# Patient Record
Sex: Female | Born: 1970
Health system: Southern US, Community
[De-identification: ages and names within clinical notes are randomized; demographics above are authoritative.]

## PROBLEM LIST (undated history)

## (undated) DIAGNOSIS — I10 Essential (primary) hypertension: Secondary | ICD-10-CM

## (undated) DIAGNOSIS — E8881 Metabolic syndrome: Secondary | ICD-10-CM

## (undated) DIAGNOSIS — J309 Allergic rhinitis, unspecified: Secondary | ICD-10-CM

## (undated) DIAGNOSIS — M722 Plantar fascial fibromatosis: Secondary | ICD-10-CM

## (undated) DIAGNOSIS — F32A Depression, unspecified: Secondary | ICD-10-CM

## (undated) DIAGNOSIS — M47816 Spondylosis without myelopathy or radiculopathy, lumbar region: Secondary | ICD-10-CM

## (undated) DIAGNOSIS — H8109 Meniere's disease, unspecified ear: Secondary | ICD-10-CM

## (undated) DIAGNOSIS — H663X2 Other chronic suppurative otitis media, left ear: Secondary | ICD-10-CM

## (undated) DIAGNOSIS — R350 Frequency of micturition: Secondary | ICD-10-CM

## (undated) DIAGNOSIS — R112 Nausea with vomiting, unspecified: Secondary | ICD-10-CM

## (undated) DIAGNOSIS — R519 Headache, unspecified: Secondary | ICD-10-CM

## (undated) DIAGNOSIS — E65 Localized adiposity: Secondary | ICD-10-CM

## (undated) DIAGNOSIS — E119 Type 2 diabetes mellitus without complications: Secondary | ICD-10-CM

## (undated) DIAGNOSIS — F4310 Post-traumatic stress disorder, unspecified: Secondary | ICD-10-CM

## (undated) DIAGNOSIS — J45909 Unspecified asthma, uncomplicated: Secondary | ICD-10-CM

## (undated) DIAGNOSIS — E282 Polycystic ovarian syndrome: Secondary | ICD-10-CM

## (undated) DIAGNOSIS — Z9889 Other specified postprocedural states: Secondary | ICD-10-CM

## (undated) DIAGNOSIS — F329 Major depressive disorder, single episode, unspecified: Secondary | ICD-10-CM

## (undated) DIAGNOSIS — M545 Low back pain, unspecified: Secondary | ICD-10-CM

## (undated) DIAGNOSIS — M47812 Spondylosis without myelopathy or radiculopathy, cervical region: Secondary | ICD-10-CM

## (undated) DIAGNOSIS — E785 Hyperlipidemia, unspecified: Secondary | ICD-10-CM

## (undated) DIAGNOSIS — R7989 Other specified abnormal findings of blood chemistry: Secondary | ICD-10-CM

## (undated) DIAGNOSIS — R945 Abnormal results of liver function studies: Secondary | ICD-10-CM

## (undated) DIAGNOSIS — Z87448 Personal history of other diseases of urinary system: Secondary | ICD-10-CM

## (undated) DIAGNOSIS — F419 Anxiety disorder, unspecified: Secondary | ICD-10-CM

## (undated) DIAGNOSIS — M1990 Unspecified osteoarthritis, unspecified site: Secondary | ICD-10-CM

## (undated) DIAGNOSIS — E88819 Insulin resistance, unspecified: Secondary | ICD-10-CM

## (undated) DIAGNOSIS — M533 Sacrococcygeal disorders, not elsewhere classified: Secondary | ICD-10-CM

## (undated) DIAGNOSIS — M51369 Other intervertebral disc degeneration, lumbar region without mention of lumbar back pain or lower extremity pain: Secondary | ICD-10-CM

## (undated) DIAGNOSIS — L732 Hidradenitis suppurativa: Secondary | ICD-10-CM

## (undated) DIAGNOSIS — H02849 Edema of unspecified eye, unspecified eyelid: Secondary | ICD-10-CM

## (undated) DIAGNOSIS — R51 Headache: Secondary | ICD-10-CM

## (undated) DIAGNOSIS — Z87440 Personal history of urinary (tract) infections: Secondary | ICD-10-CM

## (undated) DIAGNOSIS — M5136 Other intervertebral disc degeneration, lumbar region: Secondary | ICD-10-CM

## (undated) DIAGNOSIS — M2550 Pain in unspecified joint: Secondary | ICD-10-CM

## (undated) DIAGNOSIS — N200 Calculus of kidney: Secondary | ICD-10-CM

## (undated) DIAGNOSIS — L680 Hirsutism: Secondary | ICD-10-CM

## (undated) DIAGNOSIS — G894 Chronic pain syndrome: Secondary | ICD-10-CM

## (undated) DIAGNOSIS — L409 Psoriasis, unspecified: Secondary | ICD-10-CM

## (undated) DIAGNOSIS — R0600 Dyspnea, unspecified: Secondary | ICD-10-CM

## (undated) DIAGNOSIS — M797 Fibromyalgia: Secondary | ICD-10-CM

## (undated) DIAGNOSIS — J329 Chronic sinusitis, unspecified: Secondary | ICD-10-CM

## (undated) HISTORY — PX: MYRINGOTOMY WITH TUBE PLACEMENT: SHX5663

## (undated) HISTORY — PX: KIDNEY STONE SURGERY: SHX686

## (undated) HISTORY — PX: SHOULDER SURGERY: SHX246

## (undated) HISTORY — PX: TONSILLECTOMY: SUR1361

## (undated) HISTORY — PX: DENTAL SURGERY: SHX609

## (undated) HISTORY — PX: FINGER SURGERY: SHX640

---
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
## 2012-10-23 ENCOUNTER — Ambulatory Visit: Payer: No Typology Code available for payment source

## 2012-10-29 ENCOUNTER — Ambulatory Visit: Payer: No Typology Code available for payment source | Attending: Sports Medicine

## 2012-10-29 DIAGNOSIS — IMO0001 Reserved for inherently not codable concepts without codable children: Secondary | ICD-10-CM | POA: Insufficient documentation

## 2012-10-29 DIAGNOSIS — M2560 Stiffness of unspecified joint, not elsewhere classified: Secondary | ICD-10-CM | POA: Insufficient documentation

## 2012-10-29 DIAGNOSIS — M545 Low back pain, unspecified: Secondary | ICD-10-CM | POA: Insufficient documentation

## 2012-10-29 DIAGNOSIS — R5381 Other malaise: Secondary | ICD-10-CM | POA: Insufficient documentation

## 2012-10-29 DIAGNOSIS — R293 Abnormal posture: Secondary | ICD-10-CM | POA: Insufficient documentation

## 2012-11-04 ENCOUNTER — Ambulatory Visit: Payer: No Typology Code available for payment source | Admitting: Physical Therapy

## 2012-11-06 ENCOUNTER — Ambulatory Visit: Payer: No Typology Code available for payment source

## 2012-11-10 ENCOUNTER — Ambulatory Visit: Payer: No Typology Code available for payment source

## 2012-11-13 ENCOUNTER — Ambulatory Visit: Payer: No Typology Code available for payment source

## 2013-03-25 DIAGNOSIS — J45909 Unspecified asthma, uncomplicated: Secondary | ICD-10-CM | POA: Insufficient documentation

## 2013-03-25 DIAGNOSIS — F419 Anxiety disorder, unspecified: Secondary | ICD-10-CM | POA: Insufficient documentation

## 2013-03-30 DIAGNOSIS — F1190 Opioid use, unspecified, uncomplicated: Secondary | ICD-10-CM | POA: Insufficient documentation

## 2013-03-30 HISTORY — PX: AXILLARY HIDRADENITIS EXCISION: SUR522

## 2013-09-07 DIAGNOSIS — L680 Hirsutism: Secondary | ICD-10-CM | POA: Insufficient documentation

## 2013-09-07 DIAGNOSIS — E785 Hyperlipidemia, unspecified: Secondary | ICD-10-CM | POA: Insufficient documentation

## 2013-09-07 DIAGNOSIS — E250 Congenital adrenogenital disorders associated with enzyme deficiency: Secondary | ICD-10-CM | POA: Insufficient documentation

## 2013-10-17 DIAGNOSIS — M47812 Spondylosis without myelopathy or radiculopathy, cervical region: Secondary | ICD-10-CM | POA: Insufficient documentation

## 2013-12-30 DIAGNOSIS — M533 Sacrococcygeal disorders, not elsewhere classified: Secondary | ICD-10-CM | POA: Insufficient documentation

## 2014-01-05 DIAGNOSIS — F329 Major depressive disorder, single episode, unspecified: Secondary | ICD-10-CM | POA: Insufficient documentation

## 2014-01-05 DIAGNOSIS — R0609 Other forms of dyspnea: Secondary | ICD-10-CM

## 2014-01-05 DIAGNOSIS — R350 Frequency of micturition: Secondary | ICD-10-CM | POA: Insufficient documentation

## 2014-01-05 DIAGNOSIS — M2550 Pain in unspecified joint: Secondary | ICD-10-CM | POA: Insufficient documentation

## 2014-01-05 DIAGNOSIS — M25511 Pain in right shoulder: Secondary | ICD-10-CM

## 2014-01-05 DIAGNOSIS — H8109 Meniere's disease, unspecified ear: Secondary | ICD-10-CM | POA: Insufficient documentation

## 2014-01-05 DIAGNOSIS — M7712 Lateral epicondylitis, left elbow: Secondary | ICD-10-CM | POA: Insufficient documentation

## 2014-01-05 DIAGNOSIS — G8929 Other chronic pain: Secondary | ICD-10-CM | POA: Insufficient documentation

## 2014-01-05 DIAGNOSIS — J309 Allergic rhinitis, unspecified: Secondary | ICD-10-CM | POA: Insufficient documentation

## 2014-01-05 DIAGNOSIS — N915 Oligomenorrhea, unspecified: Secondary | ICD-10-CM | POA: Insufficient documentation

## 2014-01-13 DIAGNOSIS — H9319 Tinnitus, unspecified ear: Secondary | ICD-10-CM | POA: Insufficient documentation

## 2015-06-26 ENCOUNTER — Encounter (HOSPITAL_COMMUNITY): Payer: Self-pay | Admitting: Nurse Practitioner

## 2015-06-26 ENCOUNTER — Emergency Department (HOSPITAL_COMMUNITY): Payer: BLUE CROSS/BLUE SHIELD

## 2015-06-26 ENCOUNTER — Observation Stay (HOSPITAL_COMMUNITY)
Admission: EM | Admit: 2015-06-26 | Discharge: 2015-06-27 | Disposition: A | Discharge status: Home / Self Care | Payer: BLUE CROSS/BLUE SHIELD | Attending: Internal Medicine | Admitting: Internal Medicine

## 2015-06-26 DIAGNOSIS — N179 Acute kidney failure, unspecified: Secondary | ICD-10-CM | POA: Insufficient documentation

## 2015-06-26 DIAGNOSIS — H6690 Otitis media, unspecified, unspecified ear: Secondary | ICD-10-CM | POA: Insufficient documentation

## 2015-06-26 DIAGNOSIS — E669 Obesity, unspecified: Secondary | ICD-10-CM | POA: Insufficient documentation

## 2015-06-26 DIAGNOSIS — Z6835 Body mass index (BMI) 35.0-35.9, adult: Secondary | ICD-10-CM | POA: Diagnosis not present

## 2015-06-26 DIAGNOSIS — N10 Acute tubulo-interstitial nephritis: Secondary | ICD-10-CM | POA: Diagnosis not present

## 2015-06-26 DIAGNOSIS — N390 Urinary tract infection, site not specified: Secondary | ICD-10-CM | POA: Diagnosis present

## 2015-06-26 DIAGNOSIS — I10 Essential (primary) hypertension: Secondary | ICD-10-CM | POA: Diagnosis not present

## 2015-06-26 DIAGNOSIS — R109 Unspecified abdominal pain: Secondary | ICD-10-CM

## 2015-06-26 DIAGNOSIS — M1990 Unspecified osteoarthritis, unspecified site: Secondary | ICD-10-CM | POA: Insufficient documentation

## 2015-06-26 DIAGNOSIS — A419 Sepsis, unspecified organism: Secondary | ICD-10-CM | POA: Diagnosis not present

## 2015-06-26 DIAGNOSIS — E282 Polycystic ovarian syndrome: Secondary | ICD-10-CM | POA: Diagnosis present

## 2015-06-26 DIAGNOSIS — Z87891 Personal history of nicotine dependence: Secondary | ICD-10-CM | POA: Diagnosis not present

## 2015-06-26 DIAGNOSIS — R740 Nonspecific elevation of levels of transaminase and lactic acid dehydrogenase [LDH]: Secondary | ICD-10-CM | POA: Diagnosis not present

## 2015-06-26 DIAGNOSIS — R000 Tachycardia, unspecified: Secondary | ICD-10-CM

## 2015-06-26 DIAGNOSIS — Z7984 Long term (current) use of oral hypoglycemic drugs: Secondary | ICD-10-CM | POA: Diagnosis not present

## 2015-06-26 DIAGNOSIS — N12 Tubulo-interstitial nephritis, not specified as acute or chronic: Secondary | ICD-10-CM | POA: Diagnosis present

## 2015-06-26 DIAGNOSIS — R509 Fever, unspecified: Secondary | ICD-10-CM | POA: Diagnosis present

## 2015-06-26 HISTORY — DX: Calculus of kidney: N20.0

## 2015-06-26 HISTORY — DX: Insulin resistance, unspecified: E88.819

## 2015-06-26 HISTORY — DX: Essential (primary) hypertension: I10

## 2015-06-26 HISTORY — DX: Metabolic syndrome: E88.81

## 2015-06-26 LAB — URINALYSIS, ROUTINE W REFLEX MICROSCOPIC
BILIRUBIN URINE: NEGATIVE
Glucose, UA: NEGATIVE mg/dL
KETONES UR: NEGATIVE mg/dL
NITRITE: POSITIVE — AB
PH: 6.5 (ref 5.0–8.0)
Protein, ur: 30 mg/dL — AB
SPECIFIC GRAVITY, URINE: 1.016 (ref 1.005–1.030)

## 2015-06-26 LAB — COMPREHENSIVE METABOLIC PANEL
ALK PHOS: 110 U/L (ref 38–126)
ALT: 170 U/L — AB (ref 14–54)
AST: 103 U/L — AB (ref 15–41)
Albumin: 3.4 g/dL — ABNORMAL LOW (ref 3.5–5.0)
Anion gap: 11 (ref 5–15)
BUN: 12 mg/dL (ref 6–20)
CALCIUM: 9.6 mg/dL (ref 8.9–10.3)
CHLORIDE: 105 mmol/L (ref 101–111)
CO2: 23 mmol/L (ref 22–32)
CREATININE: 1.18 mg/dL — AB (ref 0.44–1.00)
GFR, EST NON AFRICAN AMERICAN: 55 mL/min — AB (ref >60)
Glucose, Bld: 129 mg/dL — ABNORMAL HIGH (ref 65–99)
Potassium: 4.2 mmol/L (ref 3.5–5.1)
SODIUM: 139 mmol/L (ref 135–145)
Total Bilirubin: 1.5 mg/dL — ABNORMAL HIGH (ref 0.3–1.2)
Total Protein: 7 g/dL (ref 6.5–8.1)

## 2015-06-26 LAB — CBC WITH DIFFERENTIAL/PLATELET
BASOS PCT: 0 %
Basophils Absolute: 0 10*3/uL (ref 0.0–0.1)
EOS ABS: 0 10*3/uL (ref 0.0–0.7)
EOS PCT: 0 %
HCT: 37.8 % (ref 36.0–46.0)
Hemoglobin: 12.5 g/dL (ref 12.0–15.0)
LYMPHS ABS: 0.3 10*3/uL — AB (ref 0.7–4.0)
Lymphocytes Relative: 3 %
MCH: 29.2 pg (ref 26.0–34.0)
MCHC: 33.1 g/dL (ref 30.0–36.0)
MCV: 88.3 fL (ref 78.0–100.0)
MONO ABS: 0.7 10*3/uL (ref 0.1–1.0)
MONOS PCT: 7 %
NEUTROS PCT: 90 %
Neutro Abs: 8.4 10*3/uL — ABNORMAL HIGH (ref 1.7–7.7)
PLATELETS: 163 10*3/uL (ref 150–400)
RBC: 4.28 MIL/uL (ref 3.87–5.11)
RDW: 13.4 % (ref 11.5–15.5)
WBC: 9.4 10*3/uL (ref 4.0–10.5)

## 2015-06-26 LAB — URINE MICROSCOPIC-ADD ON

## 2015-06-26 LAB — I-STAT CG4 LACTIC ACID, ED
LACTIC ACID, VENOUS: 0.98 mmol/L (ref 0.5–2.0)
Lactic Acid, Venous: 1.15 mmol/L (ref 0.5–2.0)

## 2015-06-26 MED ORDER — SODIUM CHLORIDE 0.9 % IV SOLN: INTRAVENOUS | Status: DC

## 2015-06-26 MED ORDER — ONDANSETRON HCL 4 MG/2ML IJ SOLN
4.0000 mg | Freq: Once | INTRAMUSCULAR | Status: AC
  Administered 2015-06-26: 4 mg via INTRAVENOUS
  Filled 2015-06-26: qty 2

## 2015-06-26 MED ORDER — SODIUM CHLORIDE 0.9 % IV SOLN: Freq: Once | INTRAVENOUS | Status: DC

## 2015-06-26 MED ORDER — LORAZEPAM 2 MG/ML IJ SOLN
1.0000 mg | Freq: Once | INTRAMUSCULAR | Status: AC
  Administered 2015-06-26: 1 mg via INTRAVENOUS
  Filled 2015-06-26: qty 1

## 2015-06-26 MED ORDER — SODIUM CHLORIDE 0.9 % IV BOLUS (SEPSIS)
2000.0000 mL | Freq: Once | INTRAVENOUS | Status: AC
  Administered 2015-06-26: 2000 mL via INTRAVENOUS

## 2015-06-26 MED ORDER — SODIUM CHLORIDE 0.9 % IV SOLN
INTRAVENOUS | Status: DC
  Administered 2015-06-26: 20:00:00 via INTRAVENOUS

## 2015-06-26 MED ORDER — MORPHINE SULFATE (PF) 4 MG/ML IV SOLN
4.0000 mg | Freq: Once | INTRAVENOUS | Status: AC
  Administered 2015-06-26: 4 mg via INTRAVENOUS
  Filled 2015-06-26: qty 1

## 2015-06-26 MED ORDER — ENOXAPARIN SODIUM 40 MG/0.4ML ~~LOC~~ SOLN: 40.0000 mg | SUBCUTANEOUS | Status: DC

## 2015-06-26 MED ORDER — ACETAMINOPHEN 500 MG PO TABS: 1000.0000 mg | ORAL_TABLET | Freq: Once | ORAL | Status: DC

## 2015-06-26 MED ORDER — HYDROCODONE-ACETAMINOPHEN 10-325 MG PO TABS
1.0000 | ORAL_TABLET | Freq: Three times a day (TID) | ORAL | Status: DC | PRN
  Administered 2015-06-27 (×2): 1 via ORAL
  Filled 2015-06-26 (×2): qty 1

## 2015-06-26 MED ORDER — LORAZEPAM 2 MG/ML IJ SOLN: 1.0000 mg | Freq: Once | INTRAMUSCULAR | Status: DC

## 2015-06-26 MED ORDER — SODIUM CHLORIDE 0.9 % IV BOLUS (SEPSIS)
1000.0000 mL | Freq: Once | INTRAVENOUS | Status: AC
  Administered 2015-06-26: 1000 mL via INTRAVENOUS

## 2015-06-26 MED ORDER — DEXTROSE 5 % IV SOLN
1.0000 g | INTRAVENOUS | Status: DC
  Administered 2015-06-26: 1 g via INTRAVENOUS
  Filled 2015-06-26 (×2): qty 10

## 2015-06-26 MED ORDER — ACETAMINOPHEN 500 MG PO TABS
1000.0000 mg | ORAL_TABLET | Freq: Once | ORAL | Status: AC
  Administered 2015-06-26: 1000 mg via ORAL
  Filled 2015-06-26: qty 2

## 2015-06-26 MED ORDER — SODIUM CHLORIDE 0.9 % IV SOLN
INTRAVENOUS | Status: AC
  Administered 2015-06-27 (×2): via INTRAVENOUS

## 2015-06-26 NOTE — ED Notes (Signed)
She c/o 2 day history of chills and sweats, back pain, n/v, foul smelling urine. She has been taking tylenol with fever reduction but fever returns. She denies cough, congestion. She is A&Ox4, resp e/u

## 2015-06-26 NOTE — ED Notes (Signed)
Pt in xray, xray to bring over when completed.

## 2015-06-26 NOTE — ED Provider Notes (Signed)
CSN: [id number]     Arrival date & time 06/26/15  1808 History   First MD Initiated Contact with Patient 06/26/15 1840     Chief Complaint  Patient presents with  . Fever     (Consider location/radiation/quality/duration/timing/severity/associated sxs/prior Treatment) HPI Comments: Patient here complaining of two-day history of chills and myalgias foul-smelling urine slight nonreactive cough. Has had positive sick exposures. Denies any neck pain or headache or photophobia. Has had some lower back pain without hematuria or dysuria. Symptoms have been persistent. Denies any trouble swallowing. Has used over-the-counter medications without relief  Patient is a 45 y.o. female presenting with fever. The history is provided by the patient and the spouse.  Fever   Past Medical History  Diagnosis Date  . Arthritis   . Kidney stones   . Insulin resistance   . Hypertension    Past Surgical History  Procedure Laterality Date  . Cesarean section    . Tonsillectomy     History reviewed. No pertinent family history. Social History  Substance Use Topics  . Smoking status: Never Smoker   . Smokeless tobacco: None  . Alcohol Use: No   OB History    No data available     Review of Systems  Constitutional: Positive for fever.  All other systems reviewed and are negative.     Allergies  Darvon and Lyrica  Home Medications   Prior to Admission medications   Not on File   BP 111/76 mmHg  Pulse 129  Temp(Src) 98.8 F (37.1 C) (Oral)  Resp 16  Ht  (1.676 m)  Wt 93.441 kg  BMI 33.27 kg/m2  SpO2 98% Physical Exam  Constitutional: She is oriented to person, place, and time. She appears well-developed and well-nourished.  Non-toxic appearance. No distress.  HENT:  Head: Normocephalic and atraumatic.  Eyes: Conjunctivae, EOM and lids are normal. Pupils are equal, round, and reactive to light.  Neck: Normal range of motion. Neck supple. No tracheal deviation present. No  thyroid mass present.  Cardiovascular: Regular rhythm and normal heart sounds.  Tachycardia present.  Exam reveals no gallop.   No murmur heard. Pulmonary/Chest: Effort normal and breath sounds normal. No stridor. No respiratory distress. She has no decreased breath sounds. She has no wheezes. She has no rhonchi. She has no rales.  Abdominal: Soft. Normal appearance and bowel sounds are normal. She exhibits no distension. There is no tenderness. There is no rebound and no CVA tenderness.  Musculoskeletal: Normal range of motion. She exhibits no edema or tenderness.  Neurological: She is alert and oriented to person, place, and time. She has normal strength. No cranial nerve deficit or sensory deficit. GCS eye subscore is 4. GCS verbal subscore is 5. GCS motor subscore is 6.  Skin: Skin is warm and dry. No abrasion and no rash noted.  Psychiatric: She has a normal mood and affect. Her speech is normal and behavior is normal.  Nursing note and vitals reviewed.   ED Course  Procedures (including critical care time) Labs Review Labs Reviewed  COMPREHENSIVE METABOLIC PANEL - Abnormal; Notable for the following:    Glucose, Bld 129 (*)    Creatinine, Ser 1.18 (*)    Albumin 3.4 (*)    AST 103 (*)    ALT 170 (*)    Total Bilirubin 1.5 (*)    GFR calc non Af Amer 55 (*)    All other components within normal limits  CBC WITH DIFFERENTIAL/PLATELET - Abnormal; Notable  for the following:    Neutro Abs 8.4 (*)    Lymphs Abs 0.3 (*)    All other components within normal limits  URINE CULTURE  URINALYSIS, ROUTINE W REFLEX MICROSCOPIC (NOT AT [facility])  I-STAT CG4 LACTIC ACID, ED    Imaging Review No results found. I have personally reviewed and evaluated these images and lab results as part of my medical decision-making.   EKG Interpretation   Date/Time:  Sunday June 26 2015 22:15:44 EST Ventricular Rate:  153 PR Interval:  137 QRS Duration: 73 QT Interval:  311 QTC Calculation: 496 R  Axis:   83 Text Interpretation:  Sinus tachycardia Borderline repolarization  abnormality Borderline prolonged QT interval Confirmed by Freida Busman  MD,  Shaneal Barasch ([id number]) on 06/26/2015 10:46:17 PM      MDM   Final diagnoses:  None    Patient given IV fluids here for possible influenza versus UTI. During her stay in the ED she became tachycardic and sent to having [facility]. Her monitor showed a sinus tachycardia to 160. She was treated with Tylenol, IV fluids. She says she feels very anxious and does have a history of PTSD. She was given Ativan and morphine. Heart rate decreased down into the 140s. Repeat lactate is been ordered. She was started on Rocephin for suspected UTI. Will be admitted to the medicine service  CRITICAL CARE Performed by: Toy Baker Total critical care time: 50 minutes Critical care time was exclusive of separately billable procedures and treating other patients. Critical care was necessary to treat or prevent imminent or life-threatening deterioration. Critical care was time spent personally by me on the following activities: development of treatment plan with patient and/or surrogate as well as nursing, discussions with consultants, evaluation of patient's response to treatment, examination of patient, obtaining history from patient or surrogate, ordering and performing treatments and interventions, ordering and review of laboratory studies, ordering and review of radiographic studies, pulse oximetry and re-evaluation of patient's condition.     Lorre Nick, MD 06/26/15 [phone]

## 2015-06-26 NOTE — ED Notes (Signed)
Ambulating to restroom.

## 2015-06-26 NOTE — ED Notes (Signed)
Admitting at bedside 

## 2015-06-27 ENCOUNTER — Encounter (HOSPITAL_COMMUNITY): Payer: Self-pay | Admitting: Internal Medicine

## 2015-06-27 ENCOUNTER — Inpatient Hospital Stay (HOSPITAL_COMMUNITY): Payer: BLUE CROSS/BLUE SHIELD

## 2015-06-27 DIAGNOSIS — B9689 Other specified bacterial agents as the cause of diseases classified elsewhere: Secondary | ICD-10-CM

## 2015-06-27 DIAGNOSIS — N10 Acute tubulo-interstitial nephritis: Secondary | ICD-10-CM | POA: Diagnosis not present

## 2015-06-27 DIAGNOSIS — R740 Nonspecific elevation of levels of transaminase and lactic acid dehydrogenase [LDH]: Secondary | ICD-10-CM

## 2015-06-27 DIAGNOSIS — E282 Polycystic ovarian syndrome: Secondary | ICD-10-CM | POA: Diagnosis present

## 2015-06-27 DIAGNOSIS — H6693 Otitis media, unspecified, bilateral: Secondary | ICD-10-CM

## 2015-06-27 DIAGNOSIS — H6690 Otitis media, unspecified, unspecified ear: Secondary | ICD-10-CM | POA: Diagnosis present

## 2015-06-27 DIAGNOSIS — I10 Essential (primary) hypertension: Secondary | ICD-10-CM | POA: Diagnosis present

## 2015-06-27 DIAGNOSIS — N12 Tubulo-interstitial nephritis, not specified as acute or chronic: Secondary | ICD-10-CM | POA: Diagnosis present

## 2015-06-27 DIAGNOSIS — N179 Acute kidney failure, unspecified: Secondary | ICD-10-CM

## 2015-06-27 DIAGNOSIS — E279 Disorder of adrenal gland, unspecified: Secondary | ICD-10-CM | POA: Diagnosis not present

## 2015-06-27 DIAGNOSIS — Z87891 Personal history of nicotine dependence: Secondary | ICD-10-CM

## 2015-06-27 DIAGNOSIS — N390 Urinary tract infection, site not specified: Secondary | ICD-10-CM | POA: Diagnosis present

## 2015-06-27 LAB — BASIC METABOLIC PANEL
ANION GAP: 10 (ref 5–15)
BUN: 9 mg/dL (ref 6–20)
CHLORIDE: 112 mmol/L — AB (ref 101–111)
CO2: 19 mmol/L — ABNORMAL LOW (ref 22–32)
Calcium: 8.2 mg/dL — ABNORMAL LOW (ref 8.9–10.3)
Creatinine, Ser: 0.98 mg/dL (ref 0.44–1.00)
GFR calc Af Amer: >60 mL/min (ref >60)
GLUCOSE: 95 mg/dL (ref 65–99)
POTASSIUM: 3.9 mmol/L (ref 3.5–5.1)
Sodium: 141 mmol/L (ref 135–145)

## 2015-06-27 LAB — GLUCOSE, CAPILLARY
GLUCOSE-CAPILLARY: 107 mg/dL — AB (ref 65–99)
GLUCOSE-CAPILLARY: 96 mg/dL (ref 65–99)
GLUCOSE-CAPILLARY: 118 mg/dL — AB (ref 65–99)

## 2015-06-27 LAB — PREGNANCY, URINE: Preg Test, Ur: NEGATIVE

## 2015-06-27 LAB — LACTIC ACID, PLASMA
LACTIC ACID, VENOUS: 1.4 mmol/L (ref 0.5–2.0)
Lactic Acid, Venous: 1.3 mmol/L (ref 0.5–2.0)

## 2015-06-27 LAB — INFLUENZA PANEL BY PCR (TYPE A & B)
H1N1 flu by pcr: NOT DETECTED
INFLAPCR: NEGATIVE
Influenza B By PCR: NEGATIVE

## 2015-06-27 LAB — MRSA PCR SCREENING: MRSA BY PCR: NEGATIVE

## 2015-06-27 MED ORDER — CIPROFLOXACIN HCL 500 MG PO TABS
500.0000 mg | ORAL_TABLET | Freq: Two times a day (BID) | ORAL | Status: DC
  Administered 2015-06-27: 500 mg via ORAL
  Filled 2015-06-27: qty 1

## 2015-06-27 MED ORDER — PROMETHAZINE HCL 25 MG/ML IJ SOLN: 12.5000 mg | Freq: Four times a day (QID) | INTRAMUSCULAR | Status: DC | PRN

## 2015-06-27 MED ORDER — CIPROFLOXACIN HCL 500 MG PO TABS: 500.0000 mg | ORAL_TABLET | Freq: Two times a day (BID) | ORAL | Status: AC

## 2015-06-27 MED ORDER — ENOXAPARIN SODIUM 40 MG/0.4ML ~~LOC~~ SOLN: 40.0000 mg | SUBCUTANEOUS | Status: DC

## 2015-06-27 NOTE — Progress Notes (Signed)
   Subjective: Patient feeling much better this morning, feels comfortable switching to oral antibiotics and going home today. Has no complaints. Objective: Vital signs in last 24 hours: Filed Vitals:   06/27/15 0600 06/27/15 0630 06/27/15 0700 06/27/15 0800  BP: 102/72 95/60 96/64  94/68  Pulse: 99 105 96 95  Temp:    98.3 F (36.8 C)  TempSrc:    Oral  Resp: Height:      Weight:      SpO2: 98% 98% 98% 98%   Weight change:   Intake/Output Summary (Last 24 hours) at 06/27/15 1107 Last data filed at 06/27/15 0800  Gross per 24 hour  Intake 1092.5 ml  Output      0 ml  Net 1092.5 ml   General: resting in bed, no acute distress Cardiac: mild tachycardia, no rubs, murmurs or gallops Pulm: clear to auscultation bilaterally, moving normal volumes of air Abd: soft, nontender, nondistended, BS present Back: Slight left CVA tenderness, no tenderness on right Ext: warm and well perfused, no pedal edema Neuro: alert and oriented X3   Assessment/Plan: Active Problems:   Sepsis (HCC)   PCOS (polycystic ovarian syndrome)   HTN (hypertension)   UTI (urinary tract infection)   Pyelonephritis   Chronic otitis media  Acute complicated pyelonephritis: Patient with history recurrent nephrolithiasis and kidney infections. UA cloudy in appearance and showing many bacteria, large Hgb, moderate leukocytes, nitrite+, and pyuria (6-30 WBCs). CT with bilateral non-obstructive nephrolithiasis with largest up to 5 mm on the left and left sided pyelonephritis. -Discontinue IV Ceftriaxone (2/26-2/26) -Start oral Ciprofloxacin 500 mg q12h for 10 total days (2/27 -> 3/8) -F/u with PCP  Chronic otitis media Patient reports having a history of ear infections and tympanostomy tubes placed in her ears in the past. She has purulent drainage from her ears for the past 2-3 weeks. -Ciprofloxacin as above -Will need outpatient ENT appointment  Transaminitis Elevated AST 103, ALT 170, and T  bili 1.5. ALP normal (110). No prior records of LFTs. She denies any abdominal pain, no tenderness on exam. She takes several OTC/ herbal supplements which might play a role. May also be due to fatty liver, occasional alcohol use, or viral. -f/u hepatitis panel  AKI Likely due to dehydration from decreased PO intake. SCr 1.18 on admission, no baseline on file to compare. Creatinine trended down to 0.98 after fluid resuscitation.   Dispo: Discharge today to home with oral antibiotics.    LOS: 1 day   Darreld Mclean, MD 06/27/2015, 11:07 AM

## 2015-06-27 NOTE — ED Notes (Signed)
Re attempting to page admitting provider

## 2015-06-27 NOTE — H&P (Signed)
Date: 06/27/2015               Patient Name:  Misty [name] MRN: [id number]  DOB: Jul 01, 1970 Age / Sex: 45 y.o., female   PCP: Ellin Saba         Medical Service: Internal Medicine Teaching Service         Attending Physician: Dr. Doneen Poisson, MD    First Contact: Dr. Allena Katz  Pager: [phone]   Second Contact: Dr. Senaida Ores  Pager: [phone]        After Hours (After 5p/  First Contact Pager: [phone]  weekends / holidays): Second Contact Pager: [phone]   Chief Complaint: Fevers, chills, nausea, vomiting, cough, chest pain with coughing, flank pain   History of Present Illness: Patient is a 45 year old female with a past medical history of hypertension, nephrolithiasis, arthritis, PCOS presenting to the hospital with a 2 day history of fevers, chills, nausea, vomiting, shortness of breath, nonproductive-cough, pleuritic chest pain, and flank pain. Patient states she's been having fevers with temperatures up to 103F, headaches (frontal), and has been vomiting up to 5-6 times a day. Vomit is clear, mucousy and contains chunks of food. Denies having any abdominal pain or hemoptysis. She has not been able to keep any food down. Also reports noticing pus coming out of her ears for the past 2-3 weeks and states she self medicated at home with rubbing alcohol and eardrops/eyedrops as she has had several "ear infections" and tympanostomy tubes placed in her ears in the past. Reports having shortness of breath, nonproductive cough, and pleuritic chest pain for the past 2 days. Denies having any wheezing. States her throat has been feeling sore from coughing but does not have any rhinorrhea. Patient denies any history of recent travel or history of blood clots. States she is physically active and does not use any birth control pills. She is a former smoker-2-3 cigarettes per day 3 years; quit 2 years ago. She is complaining of having left-sided flank pain for the past 2 days but no dysuria, urinary  frequency or urgency. Reports losing 85 pounds in the past 2 months from eating healthy. Denies having any low-grade fevers, malaise/fatigue (prior to 2 days ago), or any melena/hematochezia. She does report having small nodule on her neck for the past 20+ years and states she was told by her doctor it was a carbuncle.  In the ED, patient was found to be hypotensive (BP 90/53) and tachycardic (HR 150s). She was given 4 L of IV fluid boluses.  Meds: Current Facility-Administered Medications  Medication Dose Route Frequency Provider Last Rate Last Dose  . 0.9 %  sodium chloride infusion   Intravenous Continuous Onnie Boer, MD 150 mL/hr at 06/27/15 0043    . cefTRIAXone (ROCEPHIN) 1 g in dextrose 5 % 50 mL IVPB  1 g Intravenous Q24H Onnie Boer, MD   Stopped at 06/26/15 2302  . enoxaparin (LOVENOX) injection 40 mg  40 mg Subcutaneous Q24H Ejiroghene E Emokpae, MD      . HYDROcodone-acetaminophen (NORCO) 10-325 MG per tablet 1 tablet  1 tablet Oral TID PRN Ejiroghene Wendall Stade, MD       Current Outpatient Prescriptions  Medication Sig Dispense Refill  . ARGININE PO Take 1 tablet by mouth at bedtime.    Marland Kitchen b complex vitamins tablet Take 1 tablet by mouth daily.    . Ginkgo Biloba (GNP GINGKO BILOBA EXTRACT PO) Take 1 tablet by mouth daily.    Marland Kitchen  HYDROcodone-acetaminophen (NORCO) 10-325 MG tablet Take 1 tablet by mouth 3 (three) times daily. scheduled  0  . IRON PO Take 1 tablet by mouth at bedtime.    Marland Kitchen LYSINE PO Take 1 tablet by mouth daily.    Marland Kitchen MAGNESIUM PO Take 2 tablets by mouth at bedtime.    . metFORMIN (GLUCOPHAGE) 850 MG tablet Take 850 mg by mouth 2 (two) times daily with a meal.   4  . Misc Natural Products (GLUCOSAMINE CHONDROITIN MSM) TABS Take 2 tablets by mouth at bedtime.    . Multiple Vitamins-Minerals (HAIR/SKIN/NAILS/BIOTIN) TABS Take 3 tablets by mouth daily.    . Multiple Vitamins-Minerals (ZINC PO) Take 1 tablet by mouth at bedtime.    . naproxen sodium  (ALEVE) 220 MG tablet Take 440 mg by mouth 2 (two) times daily as needed (fever).    . nystatin (MYCOSTATIN/NYSTOP) 100000 UNIT/GM POWD Apply 1 application topically 2 (two) times daily as needed (rash under breasts).   11  . ofloxacin (FLOXIN) 0.3 % otic solution Place 2 drops into the left ear daily. Started approx 1st week of February    . Omega-3 Fatty Acids (FISH OIL) 1200 MG CAPS Take 1,200 mg by mouth at bedtime.    . Probiotic Product (PROBIOTIC PO) Take 1 tablet by mouth 3 (three) times daily.    Marland Kitchen PROLINE PO Take 1 tablet by mouth daily.    . traMADol (ULTRAM) 50 MG tablet Take 100 mg by mouth every 8 (eight) hours as needed (pain).   1    Allergies: Allergies as of 06/26/2015 - Review Complete 06/26/2015  Allergen Reaction Noted  . Darvon [propoxyphene] Other (See Comments) 06/26/2015  . Lyrica [pregabalin] Itching and Swelling 06/26/2015  . Metronidazole Nausea And Vomiting 06/26/2015  . Adhesive [tape] Itching and Rash 06/26/2015   Past Medical History  Diagnosis Date  . Arthritis   . Kidney stones   . Insulin resistance   . Hypertension    Past Surgical History  Procedure Laterality Date  . Cesarean section    . Tonsillectomy     Family History  Problem Relation Age of Onset  . COPD Mother   . Hypertension Mother    Social History   Social History  . Marital Status: Married    Spouse Name: N/A  . Number of Children: N/A  . Years of Education: N/A   Occupational History  . Not on file.   Social History Main Topics  . Smoking status: Former Games developer  . Smokeless tobacco: Not on file     Comment: 2-3 cigarettes per day 3 years; quit 2 years ago  . Alcohol Use: 0.0 oz/week    0 Standard drinks or equivalent per week     Comment: occasional   . Drug Use: No  . Sexual Activity: Not on file   Other Topics Concern  . Not on file   Social History Narrative    Review of Systems: Review of Systems  Constitutional: Positive for fever, chills and  malaise/fatigue.  HENT: Positive for ear discharge. Negative for congestion.   Eyes: Negative for blurred vision and pain.  Respiratory: Positive for cough and shortness of breath. Negative for sputum production and wheezing.   Cardiovascular: Negative for palpitations and leg swelling.       Pleuritic chest pain  Gastrointestinal: Positive for nausea and vomiting. Negative for abdominal pain, diarrhea, constipation, blood in stool and melena.  Genitourinary: Positive for flank pain. Negative for dysuria, urgency and frequency.  Hematuria (chronic, intermittent)   Musculoskeletal: Negative for back pain and joint pain.  Skin: Negative for itching and rash.  Neurological: Positive for headaches. Negative for sensory change and focal weakness.    Physical Exam: Blood pressure 87/64, pulse 115, temperature 98.8 F (37.1 C), temperature source Rectal, resp. rate 19, height  (1.676 m), weight 93.441 kg (206 lb), SpO2 100 %. Physical Exam  Constitutional: She is oriented to person, place, and time. She appears well-developed and well-nourished. No distress.  Face appears flushed   HENT:  Head: Normocephalic and atraumatic.  Tongue appears dry Oropharynx slightly erythematous Left ear: Tympanic membrane is pearly white Right ear: Slight purulent effusion noted behind the tympanic membrane  Eyes: EOM are normal. Pupils are equal, round, and reactive to light.  Neck: Neck supple. No tracheal deviation present.  Cardiovascular: Intact distal pulses.  Exam reveals no gallop and no friction rub.   No murmur heard. Tachycardic (heart rate in 150s)  Pulmonary/Chest: Effort normal and breath sounds normal. No respiratory distress. She has no wheezes. She has no rales.  Abdominal: Soft. Bowel sounds are normal. She exhibits no distension. There is no rebound and no guarding.  Abdominal striae noted Mild RUQ tenderness No hepatomegaly appreciated   Musculoskeletal: She exhibits no edema.   + Left-sided CVA tenderness Calves are non-swollen, non-erythematous, symmetrical in size, and NTTP.  Neurological: She is alert and oriented to person, place, and time.  Skin: Skin is warm and dry. No rash noted.  2 x 2 centimeter nodule noted on the posterior aspect of the neck (R side) - chronic as per patient. NTTP.   Psychiatric: She has a normal mood and affect. Her behavior is normal.    Lab results: Basic Metabolic Panel:  Recent Labs  09/81/19 1842 06/27/15 0027  NA 139 141  K 4.2 3.9  CL 105 112*  CO2 23 19*  GLUCOSE 129* 95  BUN 12 9  CREATININE 1.18* 0.98  CALCIUM 9.6 8.2*   Liver Function Tests:  Recent Labs  06/26/15 1842  AST 103*  ALT 170*  ALKPHOS 110  BILITOT 1.5*  PROT 7.0  ALBUMIN 3.4*   CBC:  Recent Labs  06/26/15 1842  WBC 9.4  NEUTROABS 8.4*  HGB 12.5  HCT 37.8  MCV 88.3  PLT 163   Urinalysis:  Recent Labs  06/26/15 2133  COLORURINE YELLOW  LABSPEC 1.016  PHURINE 6.5  GLUCOSEU NEGATIVE  HGBUR LARGE*  BILIRUBINUR NEGATIVE  KETONESUR NEGATIVE  PROTEINUR 30*  NITRITE POSITIVE*  LEUKOCYTESUR MODERATE*   Imaging results:  Dg Chest 2 View  06/26/2015  CLINICAL DATA:  Fever, shortness of breath, and nausea and vomiting 3 days. Former smoker. Hypertension. EXAM: CHEST  2 VIEW COMPARISON:  None. FINDINGS: The heart size and mediastinal contours are within normal limits. Both lungs are clear. The visualized skeletal structures are unremarkable. IMPRESSION: No active cardiopulmonary disease. Electronically Signed   By: Myles Rosenthal M.D.   On: 06/26/2015 19:42    Other results: EKG: Sinus tachycardia (HR 153). PR interval normal. QRS duration normal. QTc prolongation (496).   Assessment & Plan by Problem: Active Problems:   Sepsis (HCC)   PCOS (polycystic ovarian syndrome)   HTN (hypertension)   UTI (urinary tract infection)   Pyelonephritis   Chronic otitis media  Sepsis likely 2/2 UTI and acute complicated pyelonephritis    Patient presented hypotensive (BP 90/53) and tachycardic (HR 150s). Reports having subjective fevers, chills, headaches, nausea, and vomiting. Lactic acid  normal. Influenza PCR negative. Physical exam remarkable for left-sided CVA tenderness. Pt does report having a history of recurrent nephrolithiasis and kidney infections. UA cloudy in appearance and showing many bacteria, large Hgb, moderate leukocytes, nitrite+, and pyuria (6-30 WBCs). Patient does report having SOB, non-productive cough, and pleuritic chest pain but CXR is normal making pneumonia less likely. Symptoms could likely be due to a viral URI. She was given 4L of IVF boluses in the ED but continued to be hypotensive and tachycardic. Patient was seen by PCCM and they suggested monitor her in the stepdown unit because her MAP is >65 at this time and mental status good indicating patient is perfusing well.   -Admit to stepdown  -Normal saline at 150 mL per hour -IV ceftriaxone 1 g daily for acute complicated pyelonephritis -Phenergan 12.5 mg q6 prn N/V -Tylenol 1000 mg given once in the ED for headache. Avoid giving any more acetaminophen in the setting transaminits.  -Norco 10-325 TID prn flank pain -Checking lactic acid every 3 hours -Follow-up a.m. EKG -CT abd/pelvis w/o contrast  -Blood cultures pending -Urine culture pending   Transaminitis Elevated AST 103, ALT 170, and T bili 1.5. ALP normal (110). We do not have any prior records of LFTs done on this patient. She denies any history of liver disease but did complain of mild right upper quadrant tenderness upon palpation. Drinks alcohol only on occasion. Patient is obese (BMI 33.3), as such, fatty liver could possibly account for her transaminitis. In addition, she reports taking several OTC/ herbal supplements which could also play a role. Regardless, there remains a concern for viral hepatitis. -Checking Hepatitis panel; pending  Chronic otitis media Patient reports having a  history of ear infections and tympanostomy tubes placed in her ears in the past. She had been noticing purulent drainage from her ears for the past 2-3 weeks. Physical exam remarkable for purulent effusion behind her right tympanic membrane. Home medication list contains Ofloxacin ear drops, which could be the drops the patient has been using with failure of resolution of symptoms.  -IV ceftriaxone 1 g daily -Since these episodes are recurrent for this patient, she will need outpatient ENT follow-up.   AKI Likely due to dehydration from decreased PO intake. SCr 1.18 on admission, no baseline on file to compare. Creatinine trended down to 0.98 after fluid resuscitation.  -Continue to monitor BMP   DVT/PE prophylaxis: Lovenox  Diet: Carb modified  Code: Full  Dispo: Disposition is deferred at this time, awaiting improvement of current medical problems. Anticipated discharge in approximately 2-3 day(s).   The patient does have a current PCP (Sheryl Nicola Girt) and does need an [facility] hospital follow-up appointment after discharge.  The patient does not have transportation limitations that hinder transportation to clinic appointments.  Signed: John Giovanni, MD 06/27/2015, 3:05 AM

## 2015-06-27 NOTE — Discharge Summary (Signed)
Name: Misty [name] MRN: [id number] DOB: 1971-02-03 45 y.o. PCP: Ellin Saba  Date of Admission: 06/26/2015  6:32 PM Date of Discharge: 06/27/2015 Attending Physician: Doneen Poisson, MD  Discharge Diagnosis: 1. Left sided Pyleonephritis Active Problems:   Sepsis (HCC)   PCOS (polycystic ovarian syndrome)   HTN (hypertension)   UTI (urinary tract infection)   Pyelonephritis   Chronic otitis media  Discharge Medications:   Medication List    STOP taking these medications        ARGININE PO     b complex vitamins tablet     Glucosamine Chondroitin MSM Tabs     GNP GINGKO BILOBA EXTRACT PO     HAIR/SKIN/NAILS/BIOTIN Tabs     IRON PO     LYSINE PO     MAGNESIUM PO     PROLINE PO     ZINC PO      TAKE these medications        ALEVE 220 MG tablet  Generic drug:  naproxen sodium  Take 440 mg by mouth 2 (two) times daily as needed (fever).     ciprofloxacin 500 MG tablet  Commonly known as:  CIPRO  Take 1 tablet (500 mg total) by mouth 2 (two) times daily.     Fish Oil 1200 MG Caps  Take 1,200 mg by mouth at bedtime.     HYDROcodone-acetaminophen 10-325 MG tablet  Commonly known as:  NORCO  Take 1 tablet by mouth 3 (three) times daily. scheduled     metFORMIN 850 MG tablet  Commonly known as:  GLUCOPHAGE  Take 850 mg by mouth 2 (two) times daily with a meal.     nystatin 100000 UNIT/GM Powd  Apply 1 application topically 2 (two) times daily as needed (rash under breasts).     ofloxacin 0.3 % otic solution  Commonly known as:  FLOXIN  Place 2 drops into the left ear daily. Started approx 1st week of February     PROBIOTIC PO  Take 1 tablet by mouth 3 (three) times daily.     traMADol 50 MG tablet  Commonly known as:  ULTRAM  Take 100 mg by mouth every 8 (eight) hours as needed (pain).        Disposition and follow-up:   [name] was discharged from [facility] in Good condition.  At the hospital follow up visit  please address:  1.  Antibiotic completion, Further symptoms  Herbal supplement/OTC reconciliation,  Consider ENT referral for chronic otitis media,   Monitoring of transaminitis  2.  Labs / imaging needed at time of follow-up: CMP  3.  Pending labs/ test needing follow-up: blood cultures  Follow-up Appointments:   Discharge Instructions: Discharge Instructions    Call MD for:  difficulty breathing, headache or visual disturbances    Complete by:  As directed      Call MD for:  persistant dizziness or light-headedness    Complete by:  As directed      Call MD for:  persistant nausea and vomiting    Complete by:  As directed      Call MD for:  severe uncontrolled pain    Complete by:  As directed      Call MD for:  temperature >100.4    Complete by:  As directed      Diet - low sodium heart healthy    Complete by:  As directed      Increase activity slowly  Complete by:  As directed            Consultations:    Procedures Performed:  Ct Abdomen Pelvis Wo Contrast  06/27/2015  CLINICAL DATA:  Two days chills, sweats, back pain, nausea and vomiting, flank pain. Suspect pyelonephritis. History of kidney stones, hypertension, polycystic ovarian syndrome. EXAM: CT ABDOMEN AND PELVIS WITHOUT CONTRAST TECHNIQUE: Multidetector CT imaging of the abdomen and pelvis was performed following the standard protocol without IV contrast. COMPARISON:  None. FINDINGS: LUNG BASES: Included view of the lung bases are clear. The visualized heart and pericardium are unremarkable. KIDNEYS/BLADDER: Kidneys are orthotopic, demonstrating normal size and morphology. Bilateral nephrolithiasis, numbering at least 5 bilaterally, largest on the LEFT upper pole, 5 mm. Nonspecific LEFT greater than RIGHT perinephric stranding. Trace LEFT retroperitoneal effusion. No hydronephrosis; limited assessment for renal masses on this nonenhanced examination. The unopacified ureters are normal in course and caliber.  Urinary bladder is partially distended and unremarkable. SOLID ORGANS: The liver, spleen, gallbladder, pancreas and RIGHT adrenal gland are unremarkable for this non-contrast examination. 12 mm LEFT adrenal nodule, 20 Hounsfield units is likely benign. GASTROINTESTINAL TRACT: The stomach, small and large bowel are normal in course and caliber without inflammatory changes, the sensitivity may be decreased by lack of enteric contrast. Normal appendix. PERITONEUM/RETROPERITONEUM: Aortoiliac vessels are normal in course and caliber, mild calcific atherosclerosis. No lymphadenopathy by CT size criteria. Internal reproductive organs are unremarkable. No intraperitoneal free fluid nor free air. SOFT TISSUES/ OSSEOUS STRUCTURES: Nonsuspicious. Mild bilateral inguinal lymphadenopathy is likely reactive. Mild sacroiliac osteoarthrosis. Scattered probable subcentimeter bone islands included LEFT femur. IMPRESSION: Bilateral nonobstructing nephrolithiasis measuring up to 5 mm on the LEFT. LEFT perinephric fat stranding and trace effusion can be seen with pyelonephritis. Electronically Signed   By: Awilda Metro M.D.   On: 06/27/2015 04:10   Dg Chest 2 View  06/26/2015  CLINICAL DATA:  Fever, shortness of breath, and nausea and vomiting 3 days. Former smoker. Hypertension. EXAM: CHEST  2 VIEW COMPARISON:  None. FINDINGS: The heart size and mediastinal contours are within normal limits. Both lungs are clear. The visualized skeletal structures are unremarkable. IMPRESSION: No active cardiopulmonary disease. Electronically Signed   By: Myles Rosenthal M.D.   On: 06/26/2015 19:42    2D Echo:   Cardiac Cath:   Admission HPI: Patient is a 45 year old female with a past medical history of hypertension, nephrolithiasis, arthritis, PCOS presenting to the hospital with a 2 day history of fevers, chills, nausea, vomiting, shortness of breath, nonproductive-cough, pleuritic chest pain, and flank pain. Patient states she's been  having fevers with temperatures up to 103F, headaches (frontal), and has been vomiting up to 5-6 times a day. Vomit is clear, mucousy and contains chunks of food. Denies having any abdominal pain or hemoptysis. She has not been able to keep any food down. Also reports noticing pus coming out of her ears for the past 2-3 weeks and states she self medicated at home with rubbing alcohol and eardrops/eyedrops as she has had several "ear infections" and tympanostomy tubes placed in her ears in the past. Reports having shortness of breath, nonproductive cough, and pleuritic chest pain for the past 2 days. Denies having any wheezing. States her throat has been feeling sore from coughing but does not have any rhinorrhea. Patient denies any history of recent travel or history of blood clots. States she is physically active and does not use any birth control pills. She is a former smoker-2-3 cigarettes per day 3 years;  quit 2 years ago. She is complaining of having left-sided flank pain for the past 2 days but no dysuria, urinary frequency or urgency. Reports losing 85 pounds in the past 2 months from eating healthy. Denies having any low-grade fevers, malaise/fatigue (prior to 2 days ago), or any melena/hematochezia. She does report having small nodule on her neck for the past 20+ years and states she was told by her doctor it was a carbuncle.  In the ED, patient was found to be hypotensive (BP 90/53) and tachycardic (HR 150s). She was given 4 L of IV fluid boluses.  Hospital Course by problem list: Active Problems:   Sepsis (HCC)   PCOS (polycystic ovarian syndrome)   HTN (hypertension)   UTI (urinary tract infection)   Pyelonephritis   Chronic otitis media   Acute complicated Pyelonephritis: Patient with history recurrent nephrolithiasis and kidney infections. UA cloudy in appearance and showing many bacteria, large Hgb, moderate leukocytes, nitrite+, and pyuria (6-30 WBCs). CT with bilateral non-obstructive  nephrolithiasis with largest up to 5 mm on the left and left sided pyelonephritis. Patient started on IV Ceftriaxone with quick improvement and transitioned to oral Ciprofloxacin for 10 day course. Urine culture grew E coli sensitive to Ciprofloxacin.  Transaminitis: Elevated AST 103, ALT 170, and T bili 1.5. ALP normal (110). No prior records of LFTs. She denied any abdominal pain, no tenderness on exam. She takes several OTC/ herbal supplements which might play a role. May also be due to fatty liver, occasional alcohol use, or viral. Acute Hepatic panel was negative.  AKI: Likely due to dehydration from decreased PO intake. SCr 1.18 on admission, no baseline on file to compare. Creatinine trended down to 0.98 after fluid resuscitation.    Discharge Vitals:   BP 94/68 mmHg  Pulse 95  Temp(Src) 98.3 F (36.8 C) (Oral)  Resp 15  Ht  (1.676 m)  Wt 217 lb 13 oz (98.8 kg)  BMI 35.17 kg/m2  SpO2 98%  LMP   Discharge Labs:  No results found for this or any previous visit (from the past 24 hour(s)).  Signed: Darreld Mclean, MD 06/29/2015, 6:11 PM    Services Ordered on Discharge: none Equipment Ordered on Discharge: none

## 2015-06-27 NOTE — ED Notes (Signed)
Attempting to page the admitting provider for the 4th time.

## 2015-06-27 NOTE — Progress Notes (Addendum)
MD attempted to call patients nurse at least 10 times, no response, phone rang engaged. Called [employer] operational officer to get patients nurse and was kept on hold for at least 5 mins, was finally told patients nurse phone was not working. Also no attempt to call the second pager number listed in case no response from first page.  Inocente Salles, MD IMTS.

## 2015-06-27 NOTE — Care Management Note (Signed)
Case Management Note  Patient Details  Name: Misty [name] MRN: [id number] Date of Birth: [birth year]  Subjective/Objective:     Patient is from home with spouse, pta indep.  She has insurance with medication coverage.  PCP is  Gwendolyn Fill and patient will make her a follow up apt.  Patient has no need for DME.  No needs.                Action/Plan:   Expected Discharge Date:  06/30/15               Expected Discharge Plan:  Home/Self Care  In-House Referral:     Discharge planning Services  CM Consult  Post Acute Care Choice:    Choice offered to:     DME Arranged:    DME Agency:     HH Arranged:    HH Agency:     Status of Service:  Completed, signed off  Medicare Important Message Given:    Date Medicare IM Given:    Medicare IM give by:    Date Additional Medicare IM Given:    Additional Medicare Important Message give by:     If discussed at Long Length of Stay Meetings, dates discussed:    Additional Comments:  Leone Haven, RN 06/27/2015, 11:27 AM

## 2015-06-27 NOTE — ED Notes (Signed)
Patient transported to CT 

## 2015-06-27 NOTE — Discharge Instructions (Signed)
Please follow up with your primary doctor.    Pyelonephritis, Adult Pyelonephritis is a kidney infection. The kidneys are the organs that filter a person's blood and move waste out of the bloodstream and into the urine. Urine passes from the kidneys, through the ureters, and into the bladder. There are two main types of pyelonephritis:  Infections that come on quickly without any warning (acute pyelonephritis).  Infections that last for a long period of time (chronic pyelonephritis). In most cases, the infection clears up with treatment and does not cause further problems. More severe infections or chronic infections can sometimes spread to the bloodstream or lead to other problems with the kidneys. CAUSES This condition is usually caused by:  Bacteria traveling from the bladder to the kidney through infected urine. The urine in the bladder can become infected with bacteria from:  Bladder infection (cystitis).  Inflammation of the prostate gland (prostatitis).  Sexual intercourse, in females.  Bacteria traveling from the bloodstream to the kidney. RISK FACTORS This condition is more likely to develop in:  Pregnant women.  Older people.  People who have diabetes.  People who have kidney stones or bladder stones.  People who have other abnormalities of the kidney or ureter.  People who have a catheter placed in the bladder.  People who have cancer.  People who are sexually active.  Women who use spermicides.  People who have had a prior urinary tract infection. SYMPTOMS Symptoms of this condition include:  Frequent urination.  Strong or persistent urge to urinate.  Burning or stinging when urinating.  Abdominal pain.  Back pain.  Pain in the side or flank area.  Fever.  Chills.  Blood in the urine, or dark urine.  Nausea.  Vomiting. DIAGNOSIS This condition may be diagnosed based on:  Medical history and physical exam.  Urine tests.  Blood  tests. You may also have imaging tests of the kidneys, such as an ultrasound or CT scan. TREATMENT Treatment for this condition may depend on the severity of the infection.  If the infection is mild and is found early, you may be treated with antibiotic medicines taken by mouth. You will need to drink fluids to remain hydrated.  If the infection is more severe, you may need to stay in the hospital and receive antibiotics given directly into a vein through an IV tube. You may also need to receive fluids through an IV tube if you are not able to remain hydrated. After your hospital stay, you may need to take oral antibiotics for a period of time. Other treatments may be required, depending on the cause of the infection. HOME CARE INSTRUCTIONS Medicines  Take over-the-counter and prescription medicines only as told by your health care provider.  If you were prescribed an antibiotic medicine, take it as told by your health care provider. Do not stop taking the antibiotic even if you start to feel better. General Instructions  Drink enough fluid to keep your urine clear or pale yellow.  Avoid caffeine, tea, and carbonated beverages. They tend to irritate the bladder.  Urinate often. Avoid holding in urine for long periods of time.  Urinate before and after sex.  After a bowel movement, women should cleanse from front to back. Use each tissue only once.  Keep all follow-up visits as told by your health care provider. This is important. SEEK MEDICAL CARE IF:  Your symptoms do not get better after 2 days of treatment.  Your symptoms get worse.  You  have a fever. SEEK IMMEDIATE MEDICAL CARE IF:  You are unable to take your antibiotics or fluids.  You have shaking chills.  You vomit.  You have severe flank or back pain.  You have extreme weakness or fainting.   This information is not intended to replace advice given to you by your health care provider. Make sure you discuss any  questions you have with your health care provider.   Document Released: 04/16/2005 Document Revised: 01/05/2015 Document Reviewed: 08/09/2014 Elsevier Interactive Patient Education [employer]

## 2015-06-27 NOTE — Progress Notes (Signed)
Pt finished her shower and had lunch. Volunteer called to comeget pt.

## 2015-06-27 NOTE — ED Notes (Signed)
Pt ambulated to restroom. 

## 2015-06-27 NOTE — ED Notes (Signed)
Attempting to page admitting provider regarding pts blood pressure

## 2015-06-27 NOTE — Progress Notes (Signed)
Pt going home with husband via w/c with belongings. Explained and discussed discharge instructions, prescriptions given and pt to follow up with her primary md as needed.

## 2015-06-27 NOTE — ED Notes (Signed)
Attempted to call report, pt is not appropriate for this floor, per floor staff. Pts current blood pressure is 82/51.

## 2015-06-27 NOTE — ED Notes (Signed)
Charge RN requests that this RN re pages admitting provider for change in bed request. Will have secretary page admitting provider again.

## 2015-06-27 NOTE — ED Notes (Signed)
Bed change order to be placed by admitting provider.

## 2015-06-28 LAB — HEPATITIS PANEL, ACUTE
HCV Ab: <0.1 s/co ratio (ref 0.0–0.9)
HEP B C IGM: NEGATIVE
HEP B S AG: NEGATIVE
Hep A IgM: NEGATIVE

## 2015-06-29 LAB — URINE CULTURE

## 2015-07-02 LAB — CULTURE, BLOOD (ROUTINE X 2)
Culture: NO GROWTH
Culture: NO GROWTH

## 2015-08-02 DIAGNOSIS — H663X2 Other chronic suppurative otitis media, left ear: Secondary | ICD-10-CM | POA: Insufficient documentation

## 2015-12-14 DIAGNOSIS — G894 Chronic pain syndrome: Secondary | ICD-10-CM | POA: Insufficient documentation

## 2016-01-20 DIAGNOSIS — F4310 Post-traumatic stress disorder, unspecified: Secondary | ICD-10-CM | POA: Insufficient documentation

## 2016-01-20 DIAGNOSIS — R319 Hematuria, unspecified: Secondary | ICD-10-CM | POA: Insufficient documentation

## 2017-03-12 DIAGNOSIS — H9212 Otorrhea, left ear: Secondary | ICD-10-CM | POA: Diagnosis not present

## 2017-03-12 DIAGNOSIS — Z9622 Myringotomy tube(s) status: Secondary | ICD-10-CM | POA: Diagnosis not present

## 2017-05-29 DIAGNOSIS — M47816 Spondylosis without myelopathy or radiculopathy, lumbar region: Secondary | ICD-10-CM | POA: Diagnosis not present

## 2017-05-29 DIAGNOSIS — M2550 Pain in unspecified joint: Secondary | ICD-10-CM | POA: Diagnosis not present

## 2017-05-29 DIAGNOSIS — M533 Sacrococcygeal disorders, not elsewhere classified: Secondary | ICD-10-CM | POA: Diagnosis not present

## 2017-05-29 DIAGNOSIS — M797 Fibromyalgia: Secondary | ICD-10-CM | POA: Diagnosis not present

## 2017-06-13 DIAGNOSIS — M170 Bilateral primary osteoarthritis of knee: Secondary | ICD-10-CM | POA: Diagnosis not present

## 2017-06-28 DIAGNOSIS — L400 Psoriasis vulgaris: Secondary | ICD-10-CM | POA: Diagnosis not present

## 2017-06-28 DIAGNOSIS — L304 Erythema intertrigo: Secondary | ICD-10-CM | POA: Diagnosis not present

## 2017-06-28 DIAGNOSIS — M533 Sacrococcygeal disorders, not elsewhere classified: Secondary | ICD-10-CM | POA: Diagnosis not present

## 2017-07-29 DIAGNOSIS — M533 Sacrococcygeal disorders, not elsewhere classified: Secondary | ICD-10-CM | POA: Diagnosis not present

## 2017-07-29 DIAGNOSIS — M47816 Spondylosis without myelopathy or radiculopathy, lumbar region: Secondary | ICD-10-CM | POA: Diagnosis not present

## 2017-07-29 DIAGNOSIS — M5136 Other intervertebral disc degeneration, lumbar region: Secondary | ICD-10-CM | POA: Diagnosis not present

## 2017-07-29 DIAGNOSIS — M797 Fibromyalgia: Secondary | ICD-10-CM | POA: Diagnosis not present

## 2017-08-21 DIAGNOSIS — M170 Bilateral primary osteoarthritis of knee: Secondary | ICD-10-CM | POA: Diagnosis not present

## 2017-08-21 DIAGNOSIS — M47816 Spondylosis without myelopathy or radiculopathy, lumbar region: Secondary | ICD-10-CM | POA: Diagnosis not present

## 2017-08-21 DIAGNOSIS — M533 Sacrococcygeal disorders, not elsewhere classified: Secondary | ICD-10-CM | POA: Diagnosis not present

## 2017-08-21 DIAGNOSIS — M797 Fibromyalgia: Secondary | ICD-10-CM | POA: Diagnosis not present

## 2017-10-01 ENCOUNTER — Observation Stay (HOSPITAL_COMMUNITY): Payer: BLUE CROSS/BLUE SHIELD

## 2017-10-01 ENCOUNTER — Other Ambulatory Visit: Payer: Self-pay

## 2017-10-01 ENCOUNTER — Emergency Department (HOSPITAL_COMMUNITY): Payer: BLUE CROSS/BLUE SHIELD | Admitting: Certified Registered Nurse Anesthetist

## 2017-10-01 ENCOUNTER — Emergency Department (HOSPITAL_COMMUNITY): Payer: BLUE CROSS/BLUE SHIELD

## 2017-10-01 ENCOUNTER — Encounter (HOSPITAL_COMMUNITY)
Admission: EM | Disposition: A | Discharge status: Home / Self Care | Payer: Self-pay | Source: Home / Self Care | Attending: Emergency Medicine

## 2017-10-01 ENCOUNTER — Observation Stay (HOSPITAL_COMMUNITY)
Admission: EM | Admit: 2017-10-01 | Discharge: 2017-10-03 | Disposition: A | Discharge status: Home / Self Care | Payer: BLUE CROSS/BLUE SHIELD | Attending: Urology | Admitting: Urology

## 2017-10-01 ENCOUNTER — Encounter (HOSPITAL_COMMUNITY): Payer: Self-pay

## 2017-10-01 DIAGNOSIS — Z87891 Personal history of nicotine dependence: Secondary | ICD-10-CM | POA: Insufficient documentation

## 2017-10-01 DIAGNOSIS — I10 Essential (primary) hypertension: Secondary | ICD-10-CM | POA: Diagnosis not present

## 2017-10-01 DIAGNOSIS — E119 Type 2 diabetes mellitus without complications: Secondary | ICD-10-CM | POA: Diagnosis not present

## 2017-10-01 DIAGNOSIS — Z881 Allergy status to other antibiotic agents status: Secondary | ICD-10-CM | POA: Insufficient documentation

## 2017-10-01 DIAGNOSIS — N200 Calculus of kidney: Secondary | ICD-10-CM

## 2017-10-01 DIAGNOSIS — N12 Tubulo-interstitial nephritis, not specified as acute or chronic: Secondary | ICD-10-CM

## 2017-10-01 DIAGNOSIS — N136 Pyonephrosis: Secondary | ICD-10-CM | POA: Diagnosis not present

## 2017-10-01 DIAGNOSIS — R000 Tachycardia, unspecified: Secondary | ICD-10-CM | POA: Diagnosis not present

## 2017-10-01 DIAGNOSIS — Z7982 Long term (current) use of aspirin: Secondary | ICD-10-CM | POA: Diagnosis not present

## 2017-10-01 DIAGNOSIS — Z87442 Personal history of urinary calculi: Secondary | ICD-10-CM | POA: Diagnosis not present

## 2017-10-01 DIAGNOSIS — Z79899 Other long term (current) drug therapy: Secondary | ICD-10-CM | POA: Insufficient documentation

## 2017-10-01 DIAGNOSIS — Z7984 Long term (current) use of oral hypoglycemic drugs: Secondary | ICD-10-CM | POA: Diagnosis not present

## 2017-10-01 DIAGNOSIS — B9620 Unspecified Escherichia coli [E. coli] as the cause of diseases classified elsewhere: Secondary | ICD-10-CM | POA: Diagnosis not present

## 2017-10-01 DIAGNOSIS — N390 Urinary tract infection, site not specified: Secondary | ICD-10-CM | POA: Diagnosis not present

## 2017-10-01 DIAGNOSIS — L409 Psoriasis, unspecified: Secondary | ICD-10-CM | POA: Insufficient documentation

## 2017-10-01 DIAGNOSIS — E282 Polycystic ovarian syndrome: Secondary | ICD-10-CM | POA: Insufficient documentation

## 2017-10-01 DIAGNOSIS — R1032 Left lower quadrant pain: Secondary | ICD-10-CM | POA: Diagnosis not present

## 2017-10-01 DIAGNOSIS — N10 Acute tubulo-interstitial nephritis: Secondary | ICD-10-CM | POA: Diagnosis not present

## 2017-10-01 DIAGNOSIS — N201 Calculus of ureter: Secondary | ICD-10-CM | POA: Diagnosis not present

## 2017-10-01 DIAGNOSIS — N132 Hydronephrosis with renal and ureteral calculous obstruction: Secondary | ICD-10-CM | POA: Diagnosis not present

## 2017-10-01 HISTORY — DX: Psoriasis, unspecified: L40.9

## 2017-10-01 HISTORY — DX: Type 2 diabetes mellitus without complications: E11.9

## 2017-10-01 HISTORY — PX: CYSTOSCOPY W/ URETERAL STENT PLACEMENT: SHX1429

## 2017-10-01 HISTORY — DX: Polycystic ovarian syndrome: E28.2

## 2017-10-01 LAB — COMPREHENSIVE METABOLIC PANEL
ALK PHOS: 64 U/L (ref 38–126)
ALT: 19 U/L (ref 14–54)
AST: 22 U/L (ref 15–41)
Albumin: 4.3 g/dL (ref 3.5–5.0)
Anion gap: 9 (ref 5–15)
BILIRUBIN TOTAL: 1 mg/dL (ref 0.3–1.2)
BUN: 13 mg/dL (ref 6–20)
CALCIUM: 9.4 mg/dL (ref 8.9–10.3)
CHLORIDE: 102 mmol/L (ref 101–111)
CO2: 25 mmol/L (ref 22–32)
CREATININE: 1.16 mg/dL — AB (ref 0.44–1.00)
GFR calc non Af Amer: 55 mL/min — ABNORMAL LOW (ref >60)
Glucose, Bld: 100 mg/dL — ABNORMAL HIGH (ref 65–99)
Potassium: 4.3 mmol/L (ref 3.5–5.1)
Sodium: 136 mmol/L (ref 135–145)
TOTAL PROTEIN: 7.7 g/dL (ref 6.5–8.1)

## 2017-10-01 LAB — URINALYSIS, ROUTINE W REFLEX MICROSCOPIC
BILIRUBIN URINE: NEGATIVE
Glucose, UA: NEGATIVE mg/dL
KETONES UR: NEGATIVE mg/dL
Nitrite: NEGATIVE
Protein, ur: 100 mg/dL — AB
SPECIFIC GRAVITY, URINE: 1.018 (ref 1.005–1.030)
pH: 5 (ref 5.0–8.0)

## 2017-10-01 LAB — CBC WITH DIFFERENTIAL/PLATELET
BASOS ABS: 0 10*3/uL (ref 0.0–0.1)
Basophils Relative: 0 %
EOS ABS: 0 10*3/uL (ref 0.0–0.7)
EOS PCT: 1 %
HCT: 37.6 % (ref 36.0–46.0)
Hemoglobin: 12.5 g/dL (ref 12.0–15.0)
LYMPHS ABS: 0.4 10*3/uL — AB (ref 0.7–4.0)
LYMPHS PCT: 4 %
MCH: 29.8 pg (ref 26.0–34.0)
MCHC: 33.2 g/dL (ref 30.0–36.0)
MCV: 89.5 fL (ref 78.0–100.0)
MONO ABS: 1 10*3/uL (ref 0.1–1.0)
Monocytes Relative: 11 %
Neutro Abs: 7.4 10*3/uL (ref 1.7–7.7)
Neutrophils Relative %: 84 %
Platelets: 215 10*3/uL (ref 150–400)
RBC: 4.2 MIL/uL (ref 3.87–5.11)
RDW: 12.7 % (ref 11.5–15.5)
WBC: 8.9 10*3/uL (ref 4.0–10.5)

## 2017-10-01 LAB — I-STAT CHEM 8, ED
BUN: 11 mg/dL (ref 6–20)
CALCIUM ION: 1.2 mmol/L (ref 1.15–1.40)
CREATININE: 1.1 mg/dL — AB (ref 0.44–1.00)
Chloride: 101 mmol/L (ref 101–111)
GLUCOSE: 101 mg/dL — AB (ref 65–99)
HCT: 37 % (ref 36.0–46.0)
HEMOGLOBIN: 12.6 g/dL (ref 12.0–15.0)
Potassium: 4.1 mmol/L (ref 3.5–5.1)
Sodium: 135 mmol/L (ref 135–145)
TCO2: 23 mmol/L (ref 22–32)

## 2017-10-01 LAB — GLUCOSE, CAPILLARY
Glucose-Capillary: 112 mg/dL — ABNORMAL HIGH (ref 65–99)
Glucose-Capillary: 92 mg/dL (ref 65–99)

## 2017-10-01 LAB — I-STAT CG4 LACTIC ACID, ED
LACTIC ACID, VENOUS: 1.17 mmol/L (ref 0.5–1.9)
Lactic Acid, Venous: 0.81 mmol/L (ref 0.5–1.9)

## 2017-10-01 LAB — I-STAT BETA HCG BLOOD, ED (MC, WL, AP ONLY)

## 2017-10-01 SURGERY — CYSTOSCOPY, WITH RETROGRADE PYELOGRAM AND URETERAL STENT INSERTION
Anesthesia: General | Laterality: Left

## 2017-10-01 MED ORDER — HYDROMORPHONE HCL 1 MG/ML IJ SOLN
1.0000 mg | Freq: Once | INTRAMUSCULAR | Status: AC
  Administered 2017-10-01: 1 mg via INTRAVENOUS
  Filled 2017-10-01: qty 1

## 2017-10-01 MED ORDER — FENTANYL CITRATE (PF) 100 MCG/2ML IJ SOLN
INTRAMUSCULAR | Status: AC
  Filled 2017-10-01: qty 2

## 2017-10-01 MED ORDER — ONDANSETRON HCL 4 MG/2ML IJ SOLN
INTRAMUSCULAR | Status: AC
  Filled 2017-10-01: qty 2

## 2017-10-01 MED ORDER — SCOPOLAMINE 1 MG/3DAYS TD PT72
MEDICATED_PATCH | TRANSDERMAL | Status: DC | PRN
  Administered 2017-10-01: 1 via TRANSDERMAL

## 2017-10-01 MED ORDER — ACETAMINOPHEN 325 MG PO TABS
650.0000 mg | ORAL_TABLET | Freq: Four times a day (QID) | ORAL | Status: DC
  Administered 2017-10-01: 650 mg via ORAL
  Filled 2017-10-01: qty 2

## 2017-10-01 MED ORDER — PROPOFOL 10 MG/ML IV BOLUS
INTRAVENOUS | Status: AC
  Filled 2017-10-01: qty 20

## 2017-10-01 MED ORDER — PHENYLEPHRINE HCL 10 MG/ML IJ SOLN
INTRAMUSCULAR | Status: DC | PRN
  Administered 2017-10-01: 80 ug via INTRAVENOUS

## 2017-10-01 MED ORDER — ONDANSETRON HCL 4 MG/2ML IJ SOLN
INTRAMUSCULAR | Status: DC | PRN
  Administered 2017-10-01: 4 mg via INTRAVENOUS

## 2017-10-01 MED ORDER — MIDAZOLAM HCL 2 MG/2ML IJ SOLN
INTRAMUSCULAR | Status: AC
  Filled 2017-10-01: qty 2

## 2017-10-01 MED ORDER — ONDANSETRON HCL 4 MG/2ML IJ SOLN: 4.0000 mg | INTRAMUSCULAR | Status: DC | PRN

## 2017-10-01 MED ORDER — INSULIN ASPART 100 UNIT/ML ~~LOC~~ SOLN
0.0000 [IU] | Freq: Three times a day (TID) | SUBCUTANEOUS | Status: DC
  Administered 2017-10-02: 3 [IU] via SUBCUTANEOUS
  Administered 2017-10-03: 2 [IU] via SUBCUTANEOUS

## 2017-10-01 MED ORDER — DEXAMETHASONE SODIUM PHOSPHATE 10 MG/ML IJ SOLN
INTRAMUSCULAR | Status: DC | PRN
  Administered 2017-10-01: 10 mg via INTRAVENOUS

## 2017-10-01 MED ORDER — SODIUM CHLORIDE 0.9 % IV SOLN
1000.0000 mL | INTRAVENOUS | Status: DC
  Administered 2017-10-01 (×2): 1000 mL via INTRAVENOUS

## 2017-10-01 MED ORDER — ACETAMINOPHEN 325 MG PO TABS
650.0000 mg | ORAL_TABLET | Freq: Once | ORAL | Status: AC
  Administered 2017-10-01: 650 mg via ORAL
  Filled 2017-10-01: qty 2

## 2017-10-01 MED ORDER — LACTATED RINGERS IV SOLN: INTRAVENOUS | Status: DC

## 2017-10-01 MED ORDER — DOCUSATE SODIUM 100 MG PO CAPS
100.0000 mg | ORAL_CAPSULE | Freq: Two times a day (BID) | ORAL | Status: DC
  Administered 2017-10-02 – 2017-10-03 (×3): 100 mg via ORAL
  Filled 2017-10-01 (×3): qty 1

## 2017-10-01 MED ORDER — MORPHINE SULFATE (PF) 2 MG/ML IV SOLN: 2.0000 mg | INTRAVENOUS | Status: DC | PRN

## 2017-10-01 MED ORDER — PHENOL 1.4 % MT LIQD
1.0000 | OROMUCOSAL | Status: DC | PRN
Start: 2017-10-01 — End: 2017-10-03
  Administered 2017-10-01: 1 via OROMUCOSAL
  Filled 2017-10-01: qty 177

## 2017-10-01 MED ORDER — SODIUM CHLORIDE 0.9 % IV SOLN
2.0000 g | INTRAVENOUS | Status: DC
  Administered 2017-10-01 – 2017-10-02 (×2): 2 g via INTRAVENOUS
  Filled 2017-10-01 (×3): qty 20
  Filled 2017-10-01: qty 2

## 2017-10-01 MED ORDER — SCOPOLAMINE 1 MG/3DAYS TD PT72
MEDICATED_PATCH | TRANSDERMAL | Status: AC
  Filled 2017-10-01: qty 1

## 2017-10-01 MED ORDER — SUCCINYLCHOLINE CHLORIDE 200 MG/10ML IV SOSY
PREFILLED_SYRINGE | INTRAVENOUS | Status: DC | PRN
  Administered 2017-10-01: 100 mg via INTRAVENOUS

## 2017-10-01 MED ORDER — SODIUM CHLORIDE 0.9 % IV SOLN
INTRAVENOUS | Status: DC
  Administered 2017-10-01 – 2017-10-03 (×3): via INTRAVENOUS

## 2017-10-01 MED ORDER — SODIUM CHLORIDE 0.9 % IV BOLUS
1000.0000 mL | Freq: Once | INTRAVENOUS | Status: AC
  Administered 2017-10-01: 1000 mL via INTRAVENOUS

## 2017-10-01 MED ORDER — IOHEXOL 300 MG/ML  SOLN
INTRAMUSCULAR | Status: DC | PRN
  Administered 2017-10-01: 20 mL via INTRAVENOUS

## 2017-10-01 MED ORDER — FENTANYL CITRATE (PF) 100 MCG/2ML IJ SOLN
INTRAMUSCULAR | Status: DC | PRN
  Administered 2017-10-01: 50 ug via INTRAVENOUS
  Administered 2017-10-01: 25 ug via INTRAVENOUS
  Administered 2017-10-01: 50 ug via INTRAVENOUS

## 2017-10-01 MED ORDER — ONDANSETRON HCL 4 MG/2ML IJ SOLN
4.0000 mg | Freq: Once | INTRAMUSCULAR | Status: AC
  Administered 2017-10-01: 4 mg via INTRAVENOUS
  Filled 2017-10-01: qty 2

## 2017-10-01 MED ORDER — SODIUM CHLORIDE 0.9 % IV SOLN: 2.0000 g | INTRAVENOUS | Status: DC

## 2017-10-01 MED ORDER — METOCLOPRAMIDE HCL 5 MG/ML IJ SOLN: 10.0000 mg | Freq: Once | INTRAMUSCULAR | Status: DC | PRN

## 2017-10-01 MED ORDER — DEXAMETHASONE SODIUM PHOSPHATE 10 MG/ML IJ SOLN
INTRAMUSCULAR | Status: AC
  Filled 2017-10-01: qty 1

## 2017-10-01 MED ORDER — PROPOFOL 10 MG/ML IV BOLUS
INTRAVENOUS | Status: DC | PRN
  Administered 2017-10-01: 150 mg via INTRAVENOUS

## 2017-10-01 MED ORDER — OXYBUTYNIN CHLORIDE 5 MG PO TABS
5.0000 mg | ORAL_TABLET | Freq: Three times a day (TID) | ORAL | Status: DC | PRN
  Administered 2017-10-02 – 2017-10-03 (×2): 5 mg via ORAL
  Filled 2017-10-01 (×2): qty 1

## 2017-10-01 MED ORDER — MEPERIDINE HCL 50 MG/ML IJ SOLN: 6.2500 mg | INTRAMUSCULAR | Status: DC | PRN

## 2017-10-01 MED ORDER — MIDAZOLAM HCL 5 MG/5ML IJ SOLN
INTRAMUSCULAR | Status: DC | PRN
  Administered 2017-10-01 (×2): 1 mg via INTRAVENOUS

## 2017-10-01 MED ORDER — FENTANYL CITRATE (PF) 100 MCG/2ML IJ SOLN: 25.0000 ug | INTRAMUSCULAR | Status: DC | PRN

## 2017-10-01 MED ORDER — LIDOCAINE 2% (20 MG/ML) 5 ML SYRINGE
INTRAMUSCULAR | Status: DC | PRN
  Administered 2017-10-01: 100 mg via INTRAVENOUS

## 2017-10-01 MED ORDER — OXYCODONE HCL 5 MG PO TABS
5.0000 mg | ORAL_TABLET | ORAL | Status: DC | PRN
  Administered 2017-10-02 – 2017-10-03 (×6): 5 mg via ORAL
  Filled 2017-10-01 (×6): qty 1

## 2017-10-01 SURGICAL SUPPLY — 17 items
BAG URO CATCHER STRL LF (MISCELLANEOUS) ×3 IMPLANT
BASKET ZERO TIP NITINOL 2.4FR (BASKET) IMPLANT
CATH FOLEY 2WAY  5CC 16FR SIL (CATHETERS) ×2
CATH FOLEY 2WAY 5CC 16FR SIL (CATHETERS) ×1 IMPLANT
CATH INTERMIT  6FR 70CM (CATHETERS) ×3 IMPLANT
CLOTH BEACON ORANGE TIMEOUT ST (SAFETY) ×3 IMPLANT
COVER FOOTSWITCH UNIV (MISCELLANEOUS) IMPLANT
COVER SURGICAL LIGHT HANDLE (MISCELLANEOUS) IMPLANT
GLOVE BIOGEL M STRL SZ7.5 (GLOVE) ×6 IMPLANT
GOWN STRL REUS W/TWL LRG LVL3 (GOWN DISPOSABLE) ×6 IMPLANT
GUIDEWIRE ANG ZIPWIRE 038X150 (WIRE) ×3 IMPLANT
GUIDEWIRE STR DUAL SENSOR (WIRE) IMPLANT
MANIFOLD NEPTUNE II (INSTRUMENTS) ×3 IMPLANT
PACK CYSTO (CUSTOM PROCEDURE TRAY) ×3 IMPLANT
STENT POLARIS 5FRX24 (STENTS) ×6 IMPLANT
TUBING CONNECTING 10 (TUBING) ×2 IMPLANT
TUBING CONNECTING 10' (TUBING) ×1

## 2017-10-01 NOTE — Consult Note (Addendum)
Urology Consult Note   Requesting Attending Physician:  Bethann Berkshire, MD Service Providing Consult: Urology  Consulting Attending: Berneice Heinrich   Reason for Consult:  Left obstructing stone, sepsis  HPI: Misty [name] is seen in consultation for reasons noted above at the request of Bethann Berkshire, MD for evaluation of left obstructing stone and sepsis.  This is a 47 y.o. female with left obstructing stone and sepsis  Over the past week has had Left flank pain with associated nausea, vomiting and chills. Febrile at home to 101. Symptoms worsened in the past 24 hours which brought her to the ED.   In the ED, tachycardic to the 110-130s despite fluid resuscitation. Normotensive. Stated on Rocephin  WBC - 8.9 Cr - 1.16  Lactate - 0.81 U/A - Large LE, negative nitrites, many bacteria, > 50 WBC, + clumps  CT revealed left mild hydronephrosis with ureteral dilation and calcifications in the distal left ureter (multiple stones vs a single stone) measuring 16 x 6mm.   Hx of multiple stone passages throughout her life >10. Has had ureteroscopy in the past for stones - believes it was with Dr. Edwin Cap in [facility]   Past Medical History: Past Medical History:  Diagnosis Date  . Arthritis   . Diabetes mellitus without complication (HCC)   . Hypertension   . Insulin resistance   . Kidney stones   . PCOS (polycystic ovarian syndrome)   . Psoriasis     Past Surgical History:  Past Surgical History:  Procedure Laterality Date  . CESAREAN SECTION    . DENTAL SURGERY    . FINGER SURGERY    . SHOULDER SURGERY    . TONSILLECTOMY      Medication: Current Facility-Administered Medications  Medication Dose Route Frequency Provider Last Rate Last Dose  . 0.9 %  sodium chloride infusion  1,000 mL Intravenous Continuous Kirichenko, Tatyana, PA-C      . cefTRIAXone (ROCEPHIN) 2 g in sodium chloride 0.9 % 100 mL IVPB  2 g Intravenous Q24H Kirichenko, Lemont Fillers, PA-C   Stopped at 10/01/17 1850    Current Outpatient Medications  Medication Sig Dispense Refill  . aspirin EC 81 MG tablet Take 81 mg by mouth daily.    . buprenorphine (BUTRANS - DOSED MCG/HR) 10 MCG/HR PTWK patch Place 10 mcg onto the skin every 7 (seven) days.  1  . HYDROcodone-acetaminophen (NORCO) 10-325 MG tablet Take 1 tablet by mouth 2 (two) times daily as needed for moderate pain or severe pain. scheduled  0  . Inositol Niacinate 250 MG TABS Take 250 mg by mouth daily.    . magnesium oxide (MAG-OX) 400 MG tablet Take 400 mg by mouth daily.    . metFORMIN (GLUCOPHAGE) 850 MG tablet Take 850 mg by mouth 2 (two) times daily with a meal.   4  . naproxen sodium (ALEVE) 220 MG tablet Take 880 mg by mouth 2 (two) times daily as needed (pain).     . Probiotic Product (PROBIOTIC PO) Take 1 tablet by mouth daily.     . vitamin B-12 (CYANOCOBALAMIN) 1000 MCG tablet Take 1,000 mcg by mouth daily.    . vitamin C (ASCORBIC ACID) 500 MG tablet Take 500 mg by mouth daily.      Allergies: Allergies  Allergen Reactions  . Niacin And Related Shortness Of Breath and Nausea And Vomiting  . Darvon [Propoxyphene] Other (See Comments)    Severe headache  . Lyrica [Pregabalin] Itching and Swelling    Facial, throat swelling  .  Metronidazole Nausea And Vomiting    Felt weird   . Adhesive [Tape] Itching and Rash    Rash and itching if left on too long    Social History: Social History   Tobacco Use  . Smoking status: Former Games developer  . Smokeless tobacco: Never Used  . Tobacco comment: 2-3 cigarettes per day 3 years; quit 2 years ago  Substance Use Topics  . Alcohol use: Not Currently    Alcohol/week: 0.0 oz    Comment: occasional   . Drug use: No    Family History Family History  Problem Relation Age of Onset  . COPD Mother   . Hypertension Mother     Review of Systems 10 systems were reviewed and are negative except as noted specifically in the HPI.  Objective   Vital signs in last 24 hours: BP (!) 144/76    Pulse (!) 111   Temp 99.7 F (37.6 C) (Oral)   Resp 18   Ht 5\' 5"  (1.651 m)   Wt 74.8 kg (165 lb)   LMP 09/23/2017 Comment: PCOS  SpO2 97%   BMI 27.46 kg/m   Physical Exam General: NAD, A&O, resting, appropriate HEENT: [place]/AT, EOMI, MMM Pulmonary: Normal work of breathing Cardiovascular: HDS, adequate peripheral perfusion Abdomen: Soft, NTTP, nondistended, . GU:  left CVA tenderness Extremities: warm and well perfused Neuro: Appropriate, no focal neurological deficits  Most Recent Labs: Lab Results  Component Value Date   WBC 8.9 10/01/2017   HGB 12.6 10/01/2017   HCT 37.0 10/01/2017   PLT 215 10/01/2017    Lab Results  Component Value Date   NA 135 10/01/2017   K 4.1 10/01/2017   CL 101 10/01/2017   CO2 25 10/01/2017   BUN 11 10/01/2017   CREATININE 1.10 (H) 10/01/2017   CALCIUM 9.4 10/01/2017    No results found for: INR, APTT   IMAGING: Ct Renal Stone Study  Result Date: 10/01/2017 CLINICAL DATA:  History of nephrolithiasis. Intermittent left flank and left lower abdominal pain for 1 week. EXAM: CT ABDOMEN AND PELVIS WITHOUT CONTRAST TECHNIQUE: Multidetector CT imaging of the abdomen and pelvis was performed following the standard protocol without IV contrast. COMPARISON:  None. FINDINGS: Lower chest: No significant pulmonary nodules or acute consolidative airspace disease. Right coronary atherosclerosis. Hepatobiliary: Normal liver size. Coarse calcification at the liver dome from remote insult. No liver mass. Normal gallbladder with no radiopaque cholelithiasis. No biliary ductal dilatation. Pancreas: Normal, with no mass or duct dilation. Spleen: Tiny granulomatous splenic calcification. Normal size spleen. No splenic mass. Adrenals/Urinary Tract: Normal adrenals. Clustered obstructing 6 mm and 3 mm distal left ureteral stones located approximately 2 cm above the left UVJ, with mild left hydroureteronephrosis. Asymmetric left perinephric fat stranding. At least 3  nonobstructing 1-2 mm stones in the mid to upper left kidney. At least 3 nonobstructing 1-2 mm stones in the mid to upper right kidney. No right hydronephrosis. Normal caliber right ureter, with no right ureteral stones. No contour deforming renal masses. Normal bladder. Stomach/Bowel: Normal non-distended stomach. Normal caliber small bowel with no small bowel wall thickening. Normal appendix. Normal large bowel with no diverticulosis, large bowel wall thickening or pericolonic fat stranding. Vascular/Lymphatic: Atherosclerotic nonaneurysmal abdominal aorta. No pathologically enlarged lymph nodes in the abdomen or pelvis. Reproductive: Mildly enlarged anteverted uterus. No right adnexal mass. Simple 1.9 cm left adnexal cyst (series 2/image 67), which requires no follow-up ( This recommendation follows ACR consensus guidelines: White Paper of the ACR Incidental Findings Committee  II on Adnexal Findings. J Am Coll Radiol [phone]). Other: No pneumoperitoneum, ascites or focal fluid collection. Musculoskeletal: No aggressive appearing focal osseous lesions. Mild thoracolumbar spondylosis. IMPRESSION: 1. Clustered obstructing 6 mm and 3 mm distal left ureteral stones, with mild left hydroureteronephrosis. 2. Additional nonobstructing bilateral nephrolithiasis. 3.  Aortic Atherosclerosis (ICD10-I70.0).  Coronary atherosclerosis. Electronically Signed   By: Delbert PhenixJason A Poff M.D.   On: 10/01/2017 19:08    ------  Assessment:  47 y.o. female with left obstructing stone and sepsis.   Left distal obstructing stone: Multiple small stones in the distal ureter responsible for mild left hydronephrosis and ureteral dilation. Cr 1.16, Tachy and febrile to 101 at home. U/A dirty.   Recommend decompression with left stent. Discussed left stent placement and risks (bleeding, damage to surrounding structures, worsening infection, inability to place stent requiring PCN). Patient amenable to proceed with urgent left stent  placement.   Sepsis: Likely urinary in origin given obstructing stones, tachycardia, fever and U/A. On Rocephin, urine and blood cultures collected. Will continue BS abx and narrow as culture data results. Admit to urology - Manny - post op for continued mgmt.

## 2017-10-01 NOTE — ED Provider Notes (Addendum)
[facility]-EMERGENCY DEPT Provider Note   CSN: [id number] Arrival date & time: 10/01/17  1513     History   Chief Complaint Chief Complaint  Patient presents with  . Flank Pain  . Abdominal Pain    HPI Misty [name] is a 47 y.o. female.  HPI Misty DowMary [name] is a 47 y.o. female presents to ED flank pain on left, cloudy urine, dysuria. Reports associated fever, chills, nausea, vomiting, headache.  Patient states her symptoms initially began a week ago, but worsened yesterday.  She reports history of multiple kidney stones, as well as pyelonephritis.  Patient has taking Aleve and Vicodin for her pain at home with no improvement.  Patient states pain is sharp, radiates from the left flank into the left lower abdomen.  Last medication taken around noon, 5 hours ago.  Nothing making it better or worse.  Past Medical History:  Diagnosis Date  . Arthritis   . Diabetes mellitus without complication (HCC)   . Hypertension   . Insulin resistance   . Kidney stones   . PCOS (polycystic ovarian syndrome)   . Psoriasis     Patient Active Problem List   Diagnosis Date Noted  . PCOS (polycystic ovarian syndrome) 06/27/2015  . HTN (hypertension) 06/27/2015  . UTI (urinary tract infection) 06/27/2015  . Pyelonephritis 06/27/2015  . Chronic otitis media 06/27/2015  . Sepsis (HCC) 06/26/2015    Past Surgical History:  Procedure Laterality Date  . CESAREAN SECTION    . DENTAL SURGERY    . FINGER SURGERY    . SHOULDER SURGERY    . TONSILLECTOMY       OB History   None      Home Medications    Prior to Admission medications   Medication Sig Start Date End Date Taking? Authorizing Provider  buprenorphine (BUTRANS - DOSED MCG/HR) 10 MCG/HR PTWK patch Place 10 mcg onto the skin every 7 (seven) days. 09/30/17   [provider]  HYDROcodone-acetaminophen (NORCO) 10-325 MG tablet Take 1 tablet by mouth 3 (three) times daily. scheduled 06/03/15   [provider]  metFORMIN (GLUCOPHAGE) 850 MG tablet Take 850 mg by mouth 2 (two) times daily with a meal.  06/23/15   [provider]  naproxen sodium (ALEVE) 220 MG tablet Take 440 mg by mouth 2 (two) times daily as needed (fever).    [provider]  nystatin (MYCOSTATIN/NYSTOP) 100000 UNIT/GM POWD Apply 1 application topically 2 (two) times daily as needed (rash under breasts).  04/19/15   [provider]  ofloxacin (FLOXIN) 0.3 % otic solution Place 2 drops into the left ear daily. Started approx 1st week of February    [provider]  Omega-3 Fatty Acids (FISH OIL) 1200 MG CAPS Take 1,200 mg by mouth at bedtime.    [provider]  Probiotic Product (PROBIOTIC PO) Take 1 tablet by mouth 3 (three) times daily.    [provider]  traMADol (ULTRAM) 50 MG tablet Take 100 mg by mouth every 8 (eight) hours as needed (pain).  06/03/15   [provider]    Family History Family History  Problem Relation Age of Onset  . COPD Mother   . Hypertension Mother     Social History Social History   Tobacco Use  . Smoking status: Former Games developermoker  . Smokeless tobacco: Never Used  . Tobacco comment: 2-3 cigarettes per day 3 years; quit 2 years ago  Substance Use Topics  . Alcohol use:  Not Currently    Alcohol/week: 0.0 oz    Comment: occasional   . Drug use: No     Allergies   Darvon [propoxyphene]; Lyrica [pregabalin]; Metronidazole; and Adhesive [tape]   Review of Systems Review of Systems  Constitutional: Positive for chills and fever.  Respiratory: Negative for cough, chest tightness and shortness of breath.   Cardiovascular: Negative for chest pain, palpitations and leg swelling.  Gastrointestinal: Positive for abdominal pain, nausea and vomiting. Negative for diarrhea.  Genitourinary: Positive for dysuria, flank pain and urgency. Negative for pelvic pain, vaginal bleeding, vaginal discharge and vaginal pain.    Musculoskeletal: Negative for arthralgias, myalgias, neck pain and neck stiffness.  Skin: Negative for rash.  Neurological: Negative for dizziness, weakness and headaches.  All other systems reviewed and are negative.    Physical Exam Updated Vital Signs BP 140/90   Pulse (!) 131   Temp 99.7 F (37.6 C) (Oral)   Resp 18   Ht 5\' 5"  (1.651 m)   Wt 74.8 kg (165 lb)   LMP 09/23/2017 Comment: PCOS  SpO2 99%   BMI 27.46 kg/m   Physical Exam  Constitutional: She appears well-developed and well-nourished.  Appears to be in pain, tearful  HENT:  Head: Normocephalic.  Eyes: Conjunctivae are normal.  Neck: Neck supple.  Cardiovascular: Regular rhythm and normal heart sounds.  Tachycardic  Pulmonary/Chest: Effort normal and breath sounds normal. No respiratory distress. She has no wheezes. She has no rales.  Abdominal: Soft. Bowel sounds are normal. She exhibits no distension. There is tenderness. There is no rebound.  Left upper quadrant and left lower quadrant tenderness.  Left CVA tenderness.  Musculoskeletal: She exhibits no edema.  Neurological: She is alert.  Skin: Skin is warm and dry.  Psychiatric: She has a normal mood and affect. Her behavior is normal.  Nursing note and vitals reviewed.    ED Treatments / Results  Labs (all labs ordered are listed, but only abnormal results are displayed) Labs Reviewed  URINALYSIS, ROUTINE W REFLEX MICROSCOPIC - Abnormal; Notable for the following components:      Result Value   APPearance TURBID (*)    Hgb urine dipstick MODERATE (*)    Protein, ur 100 (*)    Leukocytes, UA LARGE (*)    WBC, UA >50 (*)    Bacteria, UA MANY (*)    All other components within normal limits  CBC WITH DIFFERENTIAL/PLATELET - Abnormal; Notable for the following components:   Lymphs Abs 0.4 (*)    All other components within normal limits  COMPREHENSIVE METABOLIC PANEL - Abnormal; Notable for the following components:   Glucose, Bld 100 (*)     Creatinine, Ser 1.16 (*)    GFR calc non Af Amer 55 (*)    All other components within normal limits  I-STAT CHEM 8, ED - Abnormal; Notable for the following components:   Creatinine, Ser 1.10 (*)    Glucose, Bld 101 (*)    All other components within normal limits  URINE CULTURE  CULTURE, BLOOD (ROUTINE X 2)  CULTURE, BLOOD (ROUTINE X 2)  I-STAT BETA HCG BLOOD, ED ([facility], [facility], [facility] ONLY)  I-STAT CG4 LACTIC ACID, ED    EKG None  Radiology No results found.  Procedures Procedures (including critical care time)  CRITICAL CARE Performed by: Kerigan Narvaez Total critical care time: 30 minutes Critical care time was exclusive of separately billable procedures and treating other patients. Critical care was necessary to treat or prevent imminent  or life-threatening deterioration. Critical care was time spent personally by me on the following activities: development of treatment plan with patient and/or surrogate as well as nursing, discussions with consultants, evaluation of patient's response to treatment, examination of patient, obtaining history from patient or surrogate, ordering and performing treatments and interventions, ordering and review of laboratory studies, ordering and review of radiographic studies, pulse oximetry and re-evaluation of patient's condition.  Medications Ordered in ED Medications - No data to display   Initial Impression / Assessment and Plan / ED Course  I have reviewed the triage vital signs and the nursing notes.  Pertinent labs & imaging results that were available during my care of the patient were reviewed by me and considered in my medical decision making (see chart for details).     Patient with fever, left flank pain, dysuria, history of kidney stones as well as pyelonephritis, meets sirs criteria.  She is tachycardic with heart rate into 130s, temperature is 100.6 orally, she appears to be very uncomfortable and tearful.  I will make her code  sepsis, antibiotics and slow fluids started.  We will wait on lactic acid before giving her 30 cc/kg by her request, she states fluids make her pain worse.  Will get CT renal for evaluation of possible kidney stone.  6:21 PM Sepsis - Repeat Assessment  Performed at:    6:21 PM   Vitals     Blood pressure (!) 144/76, pulse (!) 111, temperature 99.7 F (37.6 C), temperature source Oral, resp. rate 18, height 5\' 5"  (1.651 m), weight 74.8 kg (165 lb), last menstrual period 09/23/2017, SpO2 97 %.  Heart:     Regular rate and rhythm and Tachycardic  Lungs:    CTA  Capillary Refill:   <2 sec  Peripheral Pulse:   Radial pulse palpable  Skin:     Normal Color   7:52 PM Normal lactic acid.  Normal WBC.  Will hold off on administering any more fluids, per patient's request.  Patient's pain is well managed at this time.  CT showing distal left ureteral cluster of 6 mm and 3 mm stones.  Concerning for infected stones. Spoke with urology,will come see pt.   Will be admitted by urology.  Vitals:   10/01/17 2140 10/01/17 2200 10/01/17 2215 10/01/17 2237  BP:    128/70  Pulse:    (!) 110  Resp:  20 11 16   Temp: 99.7 F (37.6 C)  98.8 F (37.1 C)   TempSrc:      SpO2:    100%  Weight:      Height:         Final Clinical Impressions(s) / ED Diagnoses   Final diagnoses:  Kidney stone  Pyelonephritis    ED Discharge Orders    None       Jaynie Crumble, PA-C 10/01/17 2329    Jaynie Crumble, PA-C 10/01/17 2330    Bethann Berkshire, MD 10/04/17 1313

## 2017-10-01 NOTE — Brief Op Note (Signed)
10/01/2017  9:31 PM  PATIENT:  Misty [name]  47 y.o. female  PRE-OPERATIVE DIAGNOSIS:  left ureteral obstruction, ? right  POST-OPERATIVE DIAGNOSIS:  left ureteral obstruction, ? right  PROCEDURE:  Procedure(s): CYSTOSCOPY WITH RETROGRADE PYELOGRAM/URETERAL STENT PLACEMENT (Left) AND right  SURGEON:  Surgeon(s) and Role:    Sebastian Ache* Yexalen Deike, MD - Primary  PHYSICIAN ASSISTANT:   ASSISTANTS: none   ANESTHESIA:   general  EBL:  minimal   BLOOD ADMINISTERED:none  DRAINS: 55F foley to gravity   LOCAL MEDICATIONS USED:  NONE  SPECIMEN:  No Specimen  DISPOSITION OF SPECIMEN:  N/A  COUNTS:  YES  TOURNIQUET:  * No tourniquets in log *  DICTATION: .Other Dictation: Dictation Number [id number]  PLAN OF CARE: Admit to inpatient   PATIENT DISPOSITION:  PACU - hemodynamically stable.   Delay start of Pharmacological VTE agent (>24hrs) due to surgical blood loss or risk of bleeding: not applicable

## 2017-10-01 NOTE — ED Notes (Addendum)
Spoke with elink regarding lactic, antibiotic and culture times. I explained that the times for the cultures and the lactic were incorrect. Both Misty [name], [place] and myself went into the room at approx 1730 and were able to obtain both sets of cultures at 1740 along with the lactic acid. The Rocephin was then hung and scanned at 1745. I sent those down to lab and then went back into the room to administer the other medications. I will follow up with sepsis rep for our hospital to brainstorm ways to appropriately document times for our sepsis patients going forward as this is an ongoing issue.

## 2017-10-01 NOTE — Anesthesia Procedure Notes (Signed)
Procedure Name: Intubation Date/Time: 10/01/2017 9:12 PM Performed by: West Pugh, CRNA Pre-anesthesia Checklist: Patient identified, Emergency Drugs available, Suction available, Patient being monitored and Timeout performed Patient Re-evaluated:Patient Re-evaluated prior to induction Oxygen Delivery Method: Circle system utilized Preoxygenation: Pre-oxygenation with 100% oxygen Induction Type: IV induction, Rapid sequence and Cricoid Pressure applied Laryngoscope Size: Mac and 4 Grade View: Grade II Tube type: Oral Tube size: 7.0 mm Number of attempts: 1 Airway Equipment and Method: Stylet Placement Confirmation: ETT inserted through vocal cords under direct vision,  positive ETCO2,  CO2 detector and breath sounds checked- equal and bilateral Secured at: 21 cm Tube secured with: Tape Dental Injury: Teeth and Oropharynx as per pre-operative assessment

## 2017-10-01 NOTE — ED Notes (Signed)
Patient transported to OR in stable condition. The patient was instructed to take off all jewelery and accessories. All belongings were given to the husband. The husband was given all belongings  And instructed to keep them. He was also given a patient belongings bag with patient identification sticker. Before leaving patient was keep NPO and signed informed consent form.

## 2017-10-01 NOTE — ED Triage Notes (Signed)
Patient c/o intermittent left lower abdominal pain and left flank pain x 1 week. Patient reports a kidney stone history. Patient states she has intermittent urinary hesitancy.

## 2017-10-01 NOTE — Anesthesia Preprocedure Evaluation (Signed)
Anesthesia Evaluation  Patient identified by MRN, date of birth, ID band Patient awake    Reviewed: Allergy & Precautions, NPO status , Patient's Chart, lab work & pertinent test results  Airway Mallampati: II  TM Distance: >3 FB Neck ROM: Full    Dental no notable dental hx.    Pulmonary former smoker,    Pulmonary exam normal breath sounds clear to auscultation       Cardiovascular negative cardio ROS Normal cardiovascular exam Rhythm:Regular Rate:Normal     Neuro/Psych negative neurological ROS  negative psych ROS   GI/Hepatic negative GI ROS, Neg liver ROS,   Endo/Other  negative endocrine ROSdiabetes, Type 2, Oral Hypoglycemic Agents  Renal/GU negative Renal ROS  negative genitourinary   Musculoskeletal negative musculoskeletal ROS (+)   Abdominal   Peds negative pediatric ROS (+)  Hematology negative hematology ROS (+)   Anesthesia Other Findings   Reproductive/Obstetrics negative OB ROS                             Anesthesia Physical Anesthesia Plan  ASA: II  Anesthesia Plan: General   Post-op Pain Management:    Induction: Intravenous, Rapid sequence and Cricoid pressure planned  PONV Risk Score and Plan: 4 or greater and Ondansetron, Dexamethasone, Midazolam and Scopolamine patch - Pre-op  Airway Management Planned: Oral ETT  Additional Equipment:   Intra-op Plan:   Post-operative Plan: Extubation in OR  Informed Consent: I have reviewed the patients History and Physical, chart, labs and discussed the procedure including the risks, benefits and alternatives for the proposed anesthesia with the patient or authorized representative who has indicated his/her understanding and acceptance.   Dental advisory given  Plan Discussed with: CRNA  Anesthesia Plan Comments:         Anesthesia Quick Evaluation

## 2017-10-01 NOTE — Transfer of Care (Signed)
Immediate Anesthesia Transfer of Care Note  Patient: Misty [name]  Procedure(s) Performed: CYSTOSCOPY WITH RETROGRADE PYELOGRAM/URETERAL STENT PLACEMENT (Left )  Patient Location: PACU  Anesthesia Type:General  Level of Consciousness: awake, alert , oriented and patient cooperative  Airway & Oxygen Therapy: Patient Spontanous Breathing and Patient connected to face mask oxygen  Post-op Assessment: Report given to RN and Post -op Vital signs reviewed and stable  Post vital signs: Reviewed and stable  Last Vitals:  Vitals Value Taken Time  BP 143/59 10/01/2017  9:45 PM  Temp    Pulse 128 10/01/2017  9:50 PM  Resp 14 10/01/2017  9:50 PM  SpO2 100 % 10/01/2017  9:50 PM  Vitals shown include unvalidated device data.  Last Pain:  Vitals:   10/01/17 1953  TempSrc: Oral  PainSc:          Complications: No apparent anesthesia complications

## 2017-10-01 NOTE — H&P (Signed)
Misty [name] is an 47 y.o. female.    Chief Complaint: LEFT Ureteral Stone, Pyelonephritis  HPI:    Over the past week has had Left flank pain with associated nausea, vomiting and chills. Febrile at home to 101. Symptoms worsened in the past 24 hours which brought her to the ED.   In the ED, tachycardic to the 110-130s despite fluid resuscitation. Normotensive. Stated on Rocephin  WBC - 8.9 Cr - 1.16  Lactate - 0.81 U/A - Large LE, negative nitrites, many bacteria, > 50 WBC, + clumps  CT revealed left mild hydronephrosis with ureteral dilation and calcifications in the distal left ureter (multiple stones vs a single stone) measuring 16 x 6mm.   Hx of multiple stone passages throughout her life >10. Has had ureteroscopy in the past for stones - believes it was with Dr. Duckett in [place]     Past Medical History:  Diagnosis Date  . Arthritis   . Diabetes mellitus without complication (HCC)   . Hypertension   . Insulin resistance   . Kidney stones   . PCOS (polycystic ovarian syndrome)   . Psoriasis     Past Surgical History:  Procedure Laterality Date  . CESAREAN SECTION    . DENTAL SURGERY    . FINGER SURGERY    . SHOULDER SURGERY    . TONSILLECTOMY      Family History  Problem Relation Age of Onset  . COPD Mother   . Hypertension Mother    Social History:  reports that she has quit smoking. She has never used smokeless tobacco. She reports that she drank alcohol. She reports that she does not use drugs.  Allergies:  Allergies  Allergen Reactions  . Niacin And Related Shortness Of Breath and Nausea And Vomiting  . Darvon [Propoxyphene] Other (See Comments)    Severe headache  . Lyrica [Pregabalin] Itching and Swelling    Facial, throat swelling  . Metronidazole Nausea And Vomiting    Felt weird   . Adhesive [Tape] Itching and Rash    Rash and itching if left on too long     (Not in a hospital admission)  Results for orders placed or  performed during the hospital encounter of 10/01/17 (from the past 48 hour(s))  Urinalysis, Routine w reflex microscopic- may I&O cath if menses     Status: Abnormal   Collection Time: 10/01/17  4:22 PM  Result Value Ref Range   Color, Urine YELLOW YELLOW   APPearance TURBID (A) CLEAR   Specific Gravity, Urine 1.018 1.005 - 1.030   pH 5.0 5.0 - 8.0   Glucose, UA NEGATIVE NEGATIVE mg/dL   Hgb urine dipstick MODERATE (A) NEGATIVE   Bilirubin Urine NEGATIVE NEGATIVE   Ketones, ur NEGATIVE NEGATIVE mg/dL   Protein, ur 100 (A) NEGATIVE mg/dL   Nitrite NEGATIVE NEGATIVE   Leukocytes, UA LARGE (A) NEGATIVE   RBC / HPF 11-20 0 - 5 RBC/hpf   WBC, UA >50 (H) 0 - 5 WBC/hpf   Bacteria, UA MANY (A) NONE SEEN   Squamous Epithelial / LPF 0-5 0 - 5   WBC Clumps PRESENT    Mucus PRESENT     Comment: Performed at [facility], [address] [place], [place] [address]  CBC with Differential     Status: Abnormal   Collection Time: 10/01/17  5:02 PM  Result Value Ref Range   WBC 8.9 4.0 - 10.5 K/uL   RBC 4.20 3.87 - 5.11 MIL/uL     Hemoglobin 12.5 12.0 - 15.0 g/dL   HCT 37.6 36.0 - 46.0 %   MCV 89.5 78.0 - 100.0 fL   MCH 29.8 26.0 - 34.0 pg   MCHC 33.2 30.0 - 36.0 g/dL   RDW 12.7 11.5 - 15.5 %   Platelets 215 150 - 400 K/uL   Neutrophils Relative % 84 %   Neutro Abs 7.4 1.7 - 7.7 K/uL   Lymphocytes Relative 4 %   Lymphs Abs 0.4 (L) 0.7 - 4.0 K/uL   Monocytes Relative 11 %   Monocytes Absolute 1.0 0.1 - 1.0 K/uL   Eosinophils Relative 1 %   Eosinophils Absolute 0.0 0.0 - 0.7 K/uL   Basophils Relative 0 %   Basophils Absolute 0.0 0.0 - 0.1 K/uL    Comment: Performed at [facility], [place] [address] [place], [place] [address]  Comprehensive metabolic panel     Status: Abnormal   Collection Time: 10/01/17  5:02 PM  Result Value Ref Range   Sodium 136 135 - 145 mmol/L   Potassium 4.3 3.5 - 5.1 mmol/L   Chloride 102 101 - 111 mmol/L   CO2 25 22 - 32  mmol/L   Glucose, Bld 100 (H) 65 - 99 mg/dL   BUN 13 6 - 20 mg/dL   Creatinine, Ser 1.16 (H) 0.44 - 1.00 mg/dL   Calcium 9.4 8.9 - 10.3 mg/dL   Total Protein 7.7 6.5 - 8.1 g/dL   Albumin 4.3 3.5 - 5.0 g/dL   AST 22 15 - 41 U/L   ALT 19 14 - 54 U/L   Alkaline Phosphatase 64 38 - 126 U/L   Total Bilirubin 1.0 0.3 - 1.2 mg/dL   GFR calc non Af Amer 55 (L) >60 mL/min   GFR calc Af Amer >60 >60 mL/min    Comment: (NOTE) The eGFR has been calculated using the CKD EPI equation. This calculation has not been validated in all clinical situations. eGFR's persistently <60 mL/min signify possible Chronic Kidney Disease.    Anion gap 9 5 - 15    Comment: Performed at [facility], [place] [address], [place], [place] [address]  I-Stat CG4 Lactic Acid, ED  (not at  [facility])     Status: None   Collection Time: 10/01/17  6:04 PM  Result Value Ref Range   Lactic Acid, Venous 0.81 0.5 - 1.9 mmol/L  I-Stat beta hCG blood, ED     Status: None   Collection Time: 10/01/17  6:06 PM  Result Value Ref Range   I-stat hCG, quantitative <5.0 <5 mIU/mL   Comment 3            Comment:   GEST. AGE      CONC.  (mIU/mL)   <=1 WEEK        5 - 50     2 WEEKS       50 - 500     3 WEEKS       100 - 10,000     4 WEEKS     1,000 - 30,000        FEMALE AND NON-PREGNANT FEMALE:     LESS THAN 5 mIU/mL   I-Stat Chem 8, ED     Status: Abnormal   Collection Time: 10/01/17  6:07 PM  Result Value Ref Range   Sodium 135 135 - 145 mmol/L   Potassium 4.1 3.5 - 5.1 mmol/L   Chloride 101 101 - 111 mmol/L   BUN  11 6 - 20 mg/dL   Creatinine, Ser 1.10 (H) 0.44 - 1.00 mg/dL   Glucose, Bld 101 (H) 65 - 99 mg/dL   Calcium, Ion 1.20 1.15 - 1.40 mmol/L   TCO2 23 22 - 32 mmol/L   Hemoglobin 12.6 12.0 - 15.0 g/dL   HCT 37.0 36.0 - 46.0 %  I-Stat CG4 Lactic Acid, ED  (not at  [facility])     Status: None   Collection Time: 10/01/17  8:54 PM  Result Value Ref Range   Lactic Acid, Venous 1.17 0.5 - 1.9 mmol/L   Ct Renal  Stone Study  Result Date: 10/01/2017 CLINICAL DATA:  History of nephrolithiasis. Intermittent left flank and left lower abdominal pain for 1 week. EXAM: CT ABDOMEN AND PELVIS WITHOUT CONTRAST TECHNIQUE: Multidetector CT imaging of the abdomen and pelvis was performed following the standard protocol without IV contrast. COMPARISON:  None. FINDINGS: Lower chest: No significant pulmonary nodules or acute consolidative airspace disease. Right coronary atherosclerosis. Hepatobiliary: Normal liver size. Coarse calcification at the liver dome from remote insult. No liver mass. Normal gallbladder with no radiopaque cholelithiasis. No biliary ductal dilatation. Pancreas: Normal, with no mass or duct dilation. Spleen: Tiny granulomatous splenic calcification. Normal size spleen. No splenic mass. Adrenals/Urinary Tract: Normal adrenals. Clustered obstructing 6 mm and 3 mm distal left ureteral stones located approximately 2 cm above the left UVJ, with mild left hydroureteronephrosis. Asymmetric left perinephric fat stranding. At least 3 nonobstructing 1-2 mm stones in the mid to upper left kidney. At least 3 nonobstructing 1-2 mm stones in the mid to upper right kidney. No right hydronephrosis. Normal caliber right ureter, with no right ureteral stones. No contour deforming renal masses. Normal bladder. Stomach/Bowel: Normal non-distended stomach. Normal caliber small bowel with no small bowel wall thickening. Normal appendix. Normal large bowel with no diverticulosis, large bowel wall thickening or pericolonic fat stranding. Vascular/Lymphatic: Atherosclerotic nonaneurysmal abdominal aorta. No pathologically enlarged lymph nodes in the abdomen or pelvis. Reproductive: Mildly enlarged anteverted uterus. No right adnexal mass. Simple 1.9 cm left adnexal cyst (series 2/image 67), which requires no follow-up ( This recommendation follows ACR consensus guidelines: White Paper of the ACR Incidental Findings Committee II on Adnexal  Findings. J Am Coll Radiol 2013:10:675-681). Other: No pneumoperitoneum, ascites or focal fluid collection. Musculoskeletal: No aggressive appearing focal osseous lesions. Mild thoracolumbar spondylosis. IMPRESSION: 1. Clustered obstructing 6 mm and 3 mm distal left ureteral stones, with mild left hydroureteronephrosis. 2. Additional nonobstructing bilateral nephrolithiasis. 3.  Aortic Atherosclerosis (ICD10-I70.0).  Coronary atherosclerosis. Electronically Signed   By: Jason A Poff M.D.   On: 10/01/2017 19:08    Review of Systems  Constitutional: Positive for fever and malaise/fatigue.  HENT: Negative.   Eyes: Negative.   Respiratory: Negative.   Cardiovascular: Negative.   Gastrointestinal: Negative.   Genitourinary: Positive for flank pain.  Skin: Negative.   Neurological: Negative.   Endo/Heme/Allergies: Negative.   Psychiatric/Behavioral: Negative.     Blood pressure (!) 134/105, pulse (!) 130, temperature (!) 101 F (38.3 C), temperature source Oral, resp. rate 16, height 5' 5" (1.651 m), weight 74.8 kg (165 lb), last menstrual period 09/23/2017, SpO2 97 %. Physical Exam  Constitutional: She appears well-developed.  Somewhat hirsuit. Husband at bedside.   HENT:  Head: Normocephalic.  Eyes: Pupils are equal, round, and reactive to light.  Neck: Normal range of motion.  Cardiovascular: Normal rate.  Respiratory: Effort normal.  GI: Soft.  Genitourinary:  Genitourinary Comments: Some left CVAT.   Neurological: She is   alert.  Skin: Skin is warm.  Psychiatric: She has a normal mood and affect.     Assessment/Plan  Discussed with pt acute goals of renal decompression, clear infectious parameters, then definitive stone management in elective settting. Stenting preferred. Risks, benefits, alternatives, expected peri-op course including plan for post-op admission with fever free x 24 hrs and prelim CX results before DC home.   MANNY, THEODORE, MD 10/01/2017, 8:59 PM   

## 2017-10-02 ENCOUNTER — Encounter (HOSPITAL_COMMUNITY): Payer: Self-pay | Admitting: Urology

## 2017-10-02 DIAGNOSIS — N132 Hydronephrosis with renal and ureteral calculous obstruction: Secondary | ICD-10-CM | POA: Diagnosis not present

## 2017-10-02 DIAGNOSIS — N10 Acute tubulo-interstitial nephritis: Secondary | ICD-10-CM | POA: Diagnosis not present

## 2017-10-02 LAB — GLUCOSE, CAPILLARY
GLUCOSE-CAPILLARY: 105 mg/dL — AB (ref 65–99)
Glucose-Capillary: 118 mg/dL — ABNORMAL HIGH (ref 65–99)
Glucose-Capillary: 111 mg/dL — ABNORMAL HIGH (ref 65–99)
Glucose-Capillary: 132 mg/dL — ABNORMAL HIGH (ref 65–99)

## 2017-10-02 LAB — BLOOD CULTURE ID PANEL (REFLEXED)
Acinetobacter baumannii: NOT DETECTED
CANDIDA GLABRATA: NOT DETECTED
CANDIDA KRUSEI: NOT DETECTED
Candida albicans: NOT DETECTED
Candida parapsilosis: NOT DETECTED
Candida tropicalis: NOT DETECTED
Carbapenem resistance: NOT DETECTED
ENTEROBACTER CLOACAE COMPLEX: NOT DETECTED
ENTEROBACTERIACEAE SPECIES: DETECTED — AB
ESCHERICHIA COLI: DETECTED — AB
Enterococcus species: NOT DETECTED
Haemophilus influenzae: NOT DETECTED
KLEBSIELLA OXYTOCA: NOT DETECTED
Klebsiella pneumoniae: NOT DETECTED
LISTERIA MONOCYTOGENES: NOT DETECTED
NEISSERIA MENINGITIDIS: NOT DETECTED
PSEUDOMONAS AERUGINOSA: NOT DETECTED
Proteus species: NOT DETECTED
SERRATIA MARCESCENS: NOT DETECTED
STREPTOCOCCUS AGALACTIAE: NOT DETECTED
STREPTOCOCCUS PNEUMONIAE: NOT DETECTED
STREPTOCOCCUS PYOGENES: NOT DETECTED
Staphylococcus aureus (BCID): NOT DETECTED
Staphylococcus species: NOT DETECTED
Streptococcus species: NOT DETECTED

## 2017-10-02 LAB — BASIC METABOLIC PANEL
ANION GAP: 7 (ref 5–15)
BUN: 11 mg/dL (ref 6–20)
CO2: 22 mmol/L (ref 22–32)
CREATININE: 1.03 mg/dL — AB (ref 0.44–1.00)
Calcium: 8.6 mg/dL — ABNORMAL LOW (ref 8.9–10.3)
Chloride: 109 mmol/L (ref 101–111)
GFR calc non Af Amer: >60 mL/min (ref >60)
Glucose, Bld: 149 mg/dL — ABNORMAL HIGH (ref 65–99)
POTASSIUM: 4.6 mmol/L (ref 3.5–5.1)
SODIUM: 138 mmol/L (ref 135–145)

## 2017-10-02 LAB — CBC
HEMATOCRIT: 32.9 % — AB (ref 36.0–46.0)
HEMOGLOBIN: 11 g/dL — AB (ref 12.0–15.0)
MCH: 29.8 pg (ref 26.0–34.0)
MCHC: 33.4 g/dL (ref 30.0–36.0)
MCV: 89.2 fL (ref 78.0–100.0)
PLATELETS: 191 10*3/uL (ref 150–400)
RBC: 3.69 MIL/uL — AB (ref 3.87–5.11)
RDW: 12.9 % (ref 11.5–15.5)
WBC: 11.9 10*3/uL — AB (ref 4.0–10.5)

## 2017-10-02 LAB — MRSA PCR SCREENING: MRSA BY PCR: NEGATIVE

## 2017-10-02 LAB — HIV ANTIBODY (ROUTINE TESTING W REFLEX): HIV Screen 4th Generation wRfx: NONREACTIVE

## 2017-10-02 MED ORDER — ACETAMINOPHEN 325 MG PO TABS
650.0000 mg | ORAL_TABLET | Freq: Four times a day (QID) | ORAL | Status: DC
  Administered 2017-10-02 – 2017-10-03 (×4): 650 mg via ORAL
  Filled 2017-10-02 (×4): qty 2

## 2017-10-02 NOTE — Progress Notes (Signed)
PHARMACY - PHYSICIAN COMMUNICATION CRITICAL VALUE ALERT - BLOOD CULTURE IDENTIFICATION (BCID)  Misty [name] is an 47 y.o. female who presented to [facility] on 10/01/2017 with a chief complaint of ureteral stone, pyelo s/p ureteral stent placement  Assessment:  3/4 bottles blood cultures with Ecoli, no resistance detected  Name of physician (or Provider) Contacted: Narang  Current antibiotics: Rocephin 2g IV daily  Changes to prescribed antibiotics recommended:  NO changes recommended per protocol - continue Rocephin 2g IV daily  Results for orders placed or performed during the hospital encounter of 10/01/17  Blood Culture ID Panel (Reflexed) (Collected: 10/01/2017  5:56 PM)  Result Value Ref Range   Enterococcus species NOT DETECTED NOT DETECTED   Listeria monocytogenes NOT DETECTED NOT DETECTED   Staphylococcus species NOT DETECTED NOT DETECTED   Staphylococcus aureus NOT DETECTED NOT DETECTED   Streptococcus species NOT DETECTED NOT DETECTED   Streptococcus agalactiae NOT DETECTED NOT DETECTED   Streptococcus pneumoniae NOT DETECTED NOT DETECTED   Streptococcus pyogenes NOT DETECTED NOT DETECTED   Acinetobacter baumannii NOT DETECTED NOT DETECTED   Enterobacteriaceae species DETECTED (A) NOT DETECTED   Enterobacter cloacae complex NOT DETECTED NOT DETECTED   Escherichia coli DETECTED (A) NOT DETECTED   Klebsiella oxytoca NOT DETECTED NOT DETECTED   Klebsiella pneumoniae NOT DETECTED NOT DETECTED   Proteus species NOT DETECTED NOT DETECTED   Serratia marcescens NOT DETECTED NOT DETECTED   Carbapenem resistance NOT DETECTED NOT DETECTED   Haemophilus influenzae NOT DETECTED NOT DETECTED   Neisseria meningitidis NOT DETECTED NOT DETECTED   Pseudomonas aeruginosa NOT DETECTED NOT DETECTED   Candida albicans NOT DETECTED NOT DETECTED   Candida glabrata NOT DETECTED NOT DETECTED   Candida krusei NOT DETECTED NOT DETECTED   Candida parapsilosis NOT DETECTED NOT DETECTED   Candida  tropicalis NOT DETECTED NOT DETECTED    Berkley HarveyLegge, Lacie Landry Marshall 10/02/2017  11:36 AM

## 2017-10-02 NOTE — Plan of Care (Signed)
  Problem: Education: Goal: Knowledge of General Education information will improve Outcome: Progressing   Problem: Clinical Measurements: Goal: Ability to maintain clinical measurements within normal limits will improve Outcome: Progressing Goal: Will remain free from infection Outcome: Progressing Goal: Diagnostic test results will improve Outcome: Progressing   Problem: Activity: Goal: Risk for activity intolerance will decrease Outcome: Progressing   Problem: Nutrition: Goal: Adequate nutrition will be maintained Outcome: Progressing   Problem: Coping: Goal: Level of anxiety will decrease Outcome: Progressing   Problem: Elimination: Goal: Will not experience complications related to bowel motility Outcome: Progressing Goal: Will not experience complications related to urinary retention Outcome: Progressing   Problem: Pain Managment: Goal: General experience of comfort will improve Outcome: Progressing   Problem: Safety: Goal: Ability to remain free from injury will improve Outcome: Progressing   Problem: Skin Integrity: Goal: Risk for impaired skin integrity will decrease Outcome: Progressing

## 2017-10-02 NOTE — Progress Notes (Signed)
Received pt from ICU/SD in stable condition. Non-telemetry. Report received from GreenleafAmanda. Pt VSs, skin noted noted with multiple areas of abrasions and excoriated areas. Skin assessment verified by Nehemiah SettleBrooke, RN.  Pt noted with area of red patches, stating " I have Psoriasis." Lotion given for the area of irritation and redness. Pt husband at bedside. C/o pain, medicated as ordered. SRP, RN

## 2017-10-02 NOTE — Progress Notes (Signed)
Urology Progress Note   1 Day Post-Op  Subjective: NAEON.  No fevers overnight Pressure stable   Objective: Vital signs in last 24 hours: Temp:  [97.8 F (36.6 C)-101 F (38.3 C)] 97.8 F (36.6 C) (06/05 0400) Pulse Rate:  [68-143] 71 (06/05 0800) Resp:  [6-22] 6 (06/05 0800) BP: (109-157)/(55-105) 109/55 (06/05 0800) SpO2:  [97 %-100 %] 100 % (06/05 0800) Weight:  [74.8 kg (165 lb)] 74.8 kg (165 lb) (06/04 1536)  Intake/Output from previous day: 06/04 0701 - 06/05 0700 In: 2100 [I.V.:1000; IV Piggyback:1100] Out: 70 [Urine:70] Intake/Output this shift: No intake/output data recorded.  Physical Exam:  General: Alert and oriented CV: RRR Lungs: Clear Abdomen: Soft, minimally tender  GU: Foley in place draining clear yellow urine  Ext: NT, No erythema  Lab Results: Recent Labs    10/01/17 1702 10/01/17 1807 10/02/17 0319  HGB 12.5 12.6 11.0*  HCT 37.6 37.0 32.9*   BMET Recent Labs    10/01/17 1702 10/01/17 1807 10/02/17 0319  NA 136 135 138  K 4.3 4.1 4.6  CL 102 101 109  CO2 25  --  22  GLUCOSE 100* 101* 149*  BUN 13 11 11   CREATININE 1.16* 1.10* 1.03*  CALCIUM 9.4  --  8.6*     Studies/Results: Dg C-arm 1-60 Min-no Report  Result Date: 10/01/2017 Fluoroscopy was utilized by the requesting physician.  No radiographic interpretation.   Ct Renal Stone Study  Result Date: 10/01/2017 CLINICAL DATA:  History of nephrolithiasis. Intermittent left flank and left lower abdominal pain for 1 week. EXAM: CT ABDOMEN AND PELVIS WITHOUT CONTRAST TECHNIQUE: Multidetector CT imaging of the abdomen and pelvis was performed following the standard protocol without IV contrast. COMPARISON:  None. FINDINGS: Lower chest: No significant pulmonary nodules or acute consolidative airspace disease. Right coronary atherosclerosis. Hepatobiliary: Normal liver size. Coarse calcification at the liver dome from remote insult. No liver mass. Normal gallbladder with no radiopaque  cholelithiasis. No biliary ductal dilatation. Pancreas: Normal, with no mass or duct dilation. Spleen: Tiny granulomatous splenic calcification. Normal size spleen. No splenic mass. Adrenals/Urinary Tract: Normal adrenals. Clustered obstructing 6 mm and 3 mm distal left ureteral stones located approximately 2 cm above the left UVJ, with mild left hydroureteronephrosis. Asymmetric left perinephric fat stranding. At least 3 nonobstructing 1-2 mm stones in the mid to upper left kidney. At least 3 nonobstructing 1-2 mm stones in the mid to upper right kidney. No right hydronephrosis. Normal caliber right ureter, with no right ureteral stones. No contour deforming renal masses. Normal bladder. Stomach/Bowel: Normal non-distended stomach. Normal caliber small bowel with no small bowel wall thickening. Normal appendix. Normal large bowel with no diverticulosis, large bowel wall thickening or pericolonic fat stranding. Vascular/Lymphatic: Atherosclerotic nonaneurysmal abdominal aorta. No pathologically enlarged lymph nodes in the abdomen or pelvis. Reproductive: Mildly enlarged anteverted uterus. No right adnexal mass. Simple 1.9 cm left adnexal cyst (series 2/image 67), which requires no follow-up ( This recommendation follows ACR consensus guidelines: White Paper of the ACR Incidental Findings Committee II on Adnexal Findings. J Am Coll Radiol [phone]:[id number]). Other: No pneumoperitoneum, ascites or focal fluid collection. Musculoskeletal: No aggressive appearing focal osseous lesions. Mild thoracolumbar spondylosis. IMPRESSION: 1. Clustered obstructing 6 mm and 3 mm distal left ureteral stones, with mild left hydroureteronephrosis. 2. Additional nonobstructing bilateral nephrolithiasis. 3.  Aortic Atherosclerosis (ICD10-I70.0).  Coronary atherosclerosis. Electronically Signed   By: Delbert PhenixJason A Poff M.D.   On: 10/01/2017 19:08    Assessment/Plan:  47 y.o. female s/p bilateral  stent placement, left obstructing stone and  questionable right stone.  Overall doing well post-op.   - DC foley - Transfer to floor  - IVF to 75  - Regular diet  - Continue IV Rocephin  - Wean O2  - Will follow up culture data    Dispo: Tomorrow    LOS: 0 days   Misty Levans, MD 10/02/2017, 8:32 AM

## 2017-10-02 NOTE — Plan of Care (Signed)

## 2017-10-02 NOTE — Progress Notes (Signed)
PHARMACY - PHYSICIAN COMMUNICATION CRITICAL VALUE ALERT - BLOOD CULTURE IDENTIFICATION (BCID)  Misty [name] is an 47 y.o. female who presented to [facility] on 10/01/2017 with a chief complaint of ureteral stone, pyelo s/p ureteral stent placement  Assessment:  3/4 bottles blood cultures with Ecoli, no resistance detected  Name of physician (or Provider) Contacted: Narang - paged x 2 but no return call  Current antibiotics: Rocephin 2g IV daily  Changes to prescribed antibiotics recommended:  NO changes recommended per protocol - continue Rocephin 2g IV daily  Results for orders placed or performed during the hospital encounter of 10/01/17  Blood Culture ID Panel (Reflexed) (Collected: 10/01/2017  5:56 PM)  Result Value Ref Range   Enterococcus species NOT DETECTED NOT DETECTED   Listeria monocytogenes NOT DETECTED NOT DETECTED   Staphylococcus species NOT DETECTED NOT DETECTED   Staphylococcus aureus NOT DETECTED NOT DETECTED   Streptococcus species NOT DETECTED NOT DETECTED   Streptococcus agalactiae NOT DETECTED NOT DETECTED   Streptococcus pneumoniae NOT DETECTED NOT DETECTED   Streptococcus pyogenes NOT DETECTED NOT DETECTED   Acinetobacter baumannii NOT DETECTED NOT DETECTED   Enterobacteriaceae species DETECTED (A) NOT DETECTED   Enterobacter cloacae complex NOT DETECTED NOT DETECTED   Escherichia coli DETECTED (A) NOT DETECTED   Klebsiella oxytoca NOT DETECTED NOT DETECTED   Klebsiella pneumoniae NOT DETECTED NOT DETECTED   Proteus species NOT DETECTED NOT DETECTED   Serratia marcescens NOT DETECTED NOT DETECTED   Carbapenem resistance NOT DETECTED NOT DETECTED   Haemophilus influenzae NOT DETECTED NOT DETECTED   Neisseria meningitidis NOT DETECTED NOT DETECTED   Pseudomonas aeruginosa NOT DETECTED NOT DETECTED   Candida albicans NOT DETECTED NOT DETECTED   Candida glabrata NOT DETECTED NOT DETECTED   Candida krusei NOT DETECTED NOT DETECTED   Candida parapsilosis NOT  DETECTED NOT DETECTED   Candida tropicalis NOT DETECTED NOT DETECTED    Berkley HarveyLegge, Hildegarde Dunaway Marshall 10/02/2017  10:44 AM

## 2017-10-02 NOTE — Op Note (Signed)
Misty [name], Misty [name] MEDICAL RECORD WU:[id number] ACCOUNT [id number] DATE OF BIRTH:09/27/1970 FACILITY: [facility] LOCATION: [id number] PHYSICIAN:Avea Mcgowen, MD  OPERATIVE REPORT  DATE OF PROCEDURE:  10/01/2017  PREOPERATIVE DIAGNOSES:  Left ureteral stone, pyelonephritis, questionable right ureteral stone.  PROCEDURE:  Cystoscopy, bilateral pyelograms, insertion of bilateral ureteral stents 5 x 24 Polaris no tether.  ESTIMATED BLOOD LOSS:  Nil.  COMPLICATIONS:  None.  SPECIMENS:  None.  FINDINGS: 1.  Mild left hydroureteronephrosis to area of steinstrasse stones in the left distal ureter. 2.  Mild right ureteral and renal dilation, no obvious filling defects. 3.  Successful placement of bilateral stents proximal and renal pelvis, spleen or bladder.  INDICATIONS:  The patient is a pleasant 47 year old lady with history of recurrent nephrolithiasis.  She was found on workup for colicky left flank pain and malaise to have a multifocal left distal ureteral stone as well as small volume bilateral  intrarenal stones.  She was febrile and tachycardic in the emergency room with bacteriuria.  The overall picture was concerning for obstructing pyelonephritis and early sepsis.  It was clearly felt that renal decompression was warranted.  We discussed  options for renal decompression with the patient including nephrostomy tube versus stenting and she wished to proceed with stenting on the left.  After further review of her imaging, given her bilateral renal stones and some question of some pelvic acid  calcifications on the right side, which could possibly be intraureteral, decision was made to proceed with bilateral procedure to maximize renal decompression and better set her up for future bilateral procedures for stone free.  Informed consent was  then placed in the medical record.  The patient being identified, operation being cystoscopy and bilateral ureteral stent placement confirmed.  Procedure  timeout was performed.  Antibiotics administered.  General endotracheal anesthesia induced.  The  patient was placed in the low lithotomy position.  A sterile field was created by prepping and draping the patient's vagina, introitus and proximal thigh using iodine.  Cystourethroscopy was performed with a 20-French rigid cystoscope with offset lens.   Inspection of the urinary bladder revealed some mild edematous and erythematous changes of the bladder consistent with cystitis.  Ureteral orifices appeared singleton.  No papillary lesions noted.  The left ureteral orifice was cannulated with a 6-French  catheter and left ventriculogram was obtained.  Left ventriculogram showed a single left ureter single system of the kidney.  There were multifocal filling defects in the distal ureter consistent with multifocal stones with mild hydroureteronephrosis above this.  A 0.038 sensor wire was advanced into  the lower pole of which a new 5 x 24 Polaris stent was placed.  Good proximal and distal points were noted.  Efflux of urine was seen around into the distal end of the stent.  Similarly, a right retrograde pyelogram was obtained.  Right retrograde pyelogram demonstrated a single right ureter single system right kidney.  There were no obvious filling defects or narrowing noted.  There was some mild caliectasis noted.  Similarly, a 0.038 sensor wire was inserted in the lower pole  and a new 5 x 24 Polaris stent was placed.  Proximal and distal ends were noted.  Bladder was partially empty per cystoscope.  A new Foley catheter was placed through the straight drain.  The procedure was terminated.  The patient tolerated procedure  well and no immediate complications.  The patient taken to the postanesthesia care in stable condition.  Given her significant infectious parameters with tachycardia and some  hypotension, we will place her in step-down unit for close monitoring overnight.  TN/NUANCE  D:10/01/2017  T:10/02/2017 JOB:[id number]

## 2017-10-03 DIAGNOSIS — N132 Hydronephrosis with renal and ureteral calculous obstruction: Secondary | ICD-10-CM | POA: Diagnosis not present

## 2017-10-03 DIAGNOSIS — N10 Acute tubulo-interstitial nephritis: Secondary | ICD-10-CM | POA: Diagnosis not present

## 2017-10-03 LAB — URINE CULTURE: Culture: >=100000 — AB

## 2017-10-03 LAB — GLUCOSE, CAPILLARY
Glucose-Capillary: 83 mg/dL (ref 65–99)
Glucose-Capillary: 131 mg/dL — ABNORMAL HIGH (ref 65–99)

## 2017-10-03 MED ORDER — IBUPROFEN 200 MG PO TABS: 400.0000 mg | ORAL_TABLET | Freq: Four times a day (QID) | ORAL | Status: DC | PRN

## 2017-10-03 MED ORDER — HYDROCODONE-ACETAMINOPHEN 10-325 MG PO TABS: 1.0000 | ORAL_TABLET | Freq: Two times a day (BID) | ORAL | 0 refills | Status: DC | PRN

## 2017-10-03 MED ORDER — CEPHALEXIN 500 MG PO CAPS: 500.0000 mg | ORAL_CAPSULE | Freq: Three times a day (TID) | ORAL | 0 refills | Status: DC

## 2017-10-03 NOTE — Progress Notes (Signed)
Spoke with [facility]. Patient requesting Ibuprofen for pain medication and is wanting fluids D/C as hands and feet are getting "swollen and tight". Awaiting for call back.

## 2017-10-03 NOTE — Progress Notes (Signed)
Spoke with on call NP. Stated we can turn fluids down to keep line open and can order Ibuprofen 400 mg Q6 PRN.

## 2017-10-03 NOTE — Anesthesia Postprocedure Evaluation (Signed)
Anesthesia Post Note  Patient: Misty [name]  Procedure(s) Performed: CYSTOSCOPY WITH RETROGRADE PYELOGRAM/URETERAL STENT PLACEMENT (Left )     Patient location during evaluation: PACU Anesthesia Type: General Level of consciousness: awake and alert Pain management: pain level controlled Vital Signs Assessment: post-procedure vital signs reviewed and stable Respiratory status: spontaneous breathing, nonlabored ventilation, respiratory function stable and patient connected to nasal cannula oxygen Cardiovascular status: blood pressure returned to baseline and stable Postop Assessment: no apparent nausea or vomiting Anesthetic complications: no    Last Vitals:  Vitals:   10/02/17 2313 10/03/17 0452  BP: 119/76 (!) 94/59  Pulse:  80  Resp:  15  Temp:  36.8 C  SpO2:  99%    Last Pain:  Vitals:   10/03/17 0921  TempSrc:   PainSc: 3                  Phillips Groutarignan, Nathanyal Ashmead

## 2017-10-03 NOTE — Discharge Instructions (Signed)
1 - You may have urinary urgency (bladder spasms) and bloody urine on / off with stent in place. This is normal. ° °2 - Call MD or go to ER for fever >102, severe pain / nausea / vomiting not relieved by medications, or acute change in medical status ° °

## 2017-10-03 NOTE — Discharge Summary (Signed)
Physician Discharge Summary  Patient ID: Misty DowMary Fanguy MRN: [id number] DOB/AGE: 48/08/1970 48 y.o.  Admit date: 10/01/2017 Discharge date: 10/03/2017  Admission Diagnoses: Bilateral Kidney Stones, Pyelonephritis  Discharge Diagnoses:  Active Problems:   Stone, kidney   Discharged Condition: good  Hospital Course:    Pt underwent urgent cysto and bilateral stent placemetn on 10/01/17, the day of admission, for L>R renal / ureteral stones in setting of obstructed pyelonephrits. Placed on empiric rocephin in house. UCX and BCX with pan-sensitive e. Coli. By 6/6, the day of discharge, she is afebrile x 24 hours and felt to be adequtae for DC. She will return for bilateral ureteroscopy in elective settinig in few weeks.   Consults: None  Significant Diagnostic Studies: labs:  As per above  Treatments: antibiotics: rocephin  Discharge Exam: Blood pressure (!) 94/59, pulse 80, temperature 98.2 F (36.8 C), temperature source Oral, resp. rate 15, height 5\' 5"  (1.651 m), weight 74.8 kg (165 lb), last menstrual period 09/23/2017, SpO2 99 %. General appearance: alert, cooperative and appears stated age Eyes: negative Nose: Nares normal. Septum midline. Mucosa normal. No drainage or sinus tenderness. Throat: lips, mucosa, and tongue normal; teeth and gums normal Neck: supple, symmetrical, trachea midline Back: symmetric, no curvature. ROM normal. No CVA tenderness. Resp: non-labored on room air.  Cardio: Nl rate GI: soft, non-tender; bowel sounds normal; no masses,  no organomegaly Extremities: extremities normal, atraumatic, no cyanosis or edema Pulses: 2+ and symmetric Skin: Skin color, texture, turgor normal. No rashes or lesions Lymph nodes: Cervical, supraclavicular, and axillary nodes normal.  Disposition:    Allergies as of 10/03/2017      Reactions   Niacin And Related Shortness Of Breath, Nausea And Vomiting   Darvon [propoxyphene] Other (See Comments)   Severe headache   Lyrica [pregabalin] Itching, Swelling   Facial, throat swelling   Metronidazole Nausea And Vomiting   Felt weird   Adhesive [tape] Itching, Rash   Rash and itching if left on too long      Medication List    TAKE these medications   ALEVE 220 MG tablet Generic drug:  naproxen sodium Take 880 mg by mouth 2 (two) times daily as needed (pain).   aspirin EC 81 MG tablet Take 81 mg by mouth daily.   buprenorphine 10 MCG/HR Ptwk patch Commonly known as:  BUTRANS - dosed mcg/hr Place 10 mcg onto the skin every 7 (seven) days.   cephALEXin 500 MG capsule Commonly known as:  KEFLEX Take 1 capsule (500 mg total) by mouth 3 (three) times daily. X 10 days now. Also begin 2 days before next Urology surgery.   HYDROcodone-acetaminophen 10-325 MG tablet Commonly known as:  NORCO Take 1 tablet by mouth 2 (two) times daily as needed for moderate pain or severe pain. scheduled   Inositol Niacinate 250 MG Tabs Take 250 mg by mouth daily.   magnesium oxide 400 MG tablet Commonly known as:  MAG-OX Take 400 mg by mouth daily.   metFORMIN 850 MG tablet Commonly known as:  GLUCOPHAGE Take 850 mg by mouth 2 (two) times daily with a meal.   PROBIOTIC PO Take 1 tablet by mouth daily.   vitamin B-12 1000 MCG tablet Commonly known as:  CYANOCOBALAMIN Take 1,000 mcg by mouth daily.   vitamin C 500 MG tablet Commonly known as:  ASCORBIC ACID Take 500 mg by mouth daily.        SignedSebastian Ache: Junior Kenedy 10/03/2017, 12:21 PM

## 2017-10-04 LAB — CULTURE, BLOOD (ROUTINE X 2)
Special Requests: ADEQUATE
Special Requests: ADEQUATE

## 2017-10-17 ENCOUNTER — Other Ambulatory Visit: Payer: Self-pay | Admitting: Urology

## 2017-10-18 DIAGNOSIS — L237 Allergic contact dermatitis due to plants, except food: Secondary | ICD-10-CM | POA: Diagnosis not present

## 2017-10-21 ENCOUNTER — Other Ambulatory Visit: Payer: Self-pay

## 2017-10-21 ENCOUNTER — Encounter (HOSPITAL_BASED_OUTPATIENT_CLINIC_OR_DEPARTMENT_OTHER): Payer: Self-pay

## 2017-10-21 NOTE — Progress Notes (Signed)
Spoke with: Misty [name] NPO:  No food after midnight/Clear liquids until 8:00AM DOS Arrival time:  1215PM Labs: Istat8 (EKG 10/01/2017 in epic) AM medications:  None Pre op orders: Yes Ride home:  Barbara CowerJason (husband)m [phone]

## 2017-10-24 ENCOUNTER — Encounter (HOSPITAL_BASED_OUTPATIENT_CLINIC_OR_DEPARTMENT_OTHER): Payer: Self-pay | Admitting: Anesthesiology

## 2017-10-24 NOTE — Anesthesia Preprocedure Evaluation (Addendum)
Anesthesia Evaluation    History of Anesthesia Complications (+) PONV and history of anesthetic complications  Airway Mallampati: II  TM Distance: >3 FB Neck ROM: Full    Dental  (+) Dental Advisory Given, Teeth Intact   Pulmonary asthma , former smoker,    breath sounds clear to auscultation       Cardiovascular hypertension,  Rhythm:Regular Rate:Normal     Neuro/Psych  Headaches, PSYCHIATRIC DISORDERS Anxiety Depression  Neuromuscular disease    GI/Hepatic negative GI ROS,   Endo/Other  diabetes, Well Controlled, Type 2, Oral Hypoglycemic AgentsPCOS Hyperlipidemia  Renal/GU Renal diseaseBilateral ureteral calculi Hx/o Pyelonephritis     Musculoskeletal  (+) Arthritis , Osteoarthritis,  Fibromyalgia -DDD lumbar spine Chronic LBP Hx/o sacroilitis Spondylosis lumbar spine   Abdominal   Peds  Hematology negative hematology ROS (+)   Anesthesia Other Findings   Reproductive/Obstetrics Hirsutism; PCOS                         Anesthesia Physical Anesthesia Plan  ASA: II  Anesthesia Plan: General   Post-op Pain Management:    Induction: Intravenous  PONV Risk Score and Plan: 4 or greater and Scopolamine patch - Pre-op, Midazolam, Dexamethasone, Ondansetron and Treatment may vary due to age or medical condition  Airway Management Planned: LMA  Additional Equipment: None  Intra-op Plan:   Post-operative Plan: Extubation in OR  Informed Consent: I have reviewed the patients History and Physical, chart, labs and discussed the procedure including the risks, benefits and alternatives for the proposed anesthesia with the patient or authorized representative who has indicated his/her understanding and acceptance.   Dental advisory given  Plan Discussed with: CRNA and Anesthesiologist  Anesthesia Plan Comments:        Anesthesia Quick Evaluation

## 2017-10-25 ENCOUNTER — Other Ambulatory Visit: Payer: Self-pay

## 2017-10-25 ENCOUNTER — Ambulatory Visit (HOSPITAL_BASED_OUTPATIENT_CLINIC_OR_DEPARTMENT_OTHER)
Admission: RE | Admit: 2017-10-25 | Discharge: 2017-10-25 | Disposition: A | Discharge status: Home / Self Care | Payer: BLUE CROSS/BLUE SHIELD | Source: Ambulatory Visit | Attending: Urology | Admitting: Urology

## 2017-10-25 ENCOUNTER — Ambulatory Visit (HOSPITAL_BASED_OUTPATIENT_CLINIC_OR_DEPARTMENT_OTHER): Payer: BLUE CROSS/BLUE SHIELD | Admitting: Anesthesiology

## 2017-10-25 ENCOUNTER — Encounter (HOSPITAL_BASED_OUTPATIENT_CLINIC_OR_DEPARTMENT_OTHER): Payer: Self-pay | Admitting: *Deleted

## 2017-10-25 ENCOUNTER — Encounter (HOSPITAL_BASED_OUTPATIENT_CLINIC_OR_DEPARTMENT_OTHER)
Admission: RE | Disposition: A | Discharge status: Home / Self Care | Payer: Self-pay | Source: Ambulatory Visit | Attending: Urology

## 2017-10-25 DIAGNOSIS — E119 Type 2 diabetes mellitus without complications: Secondary | ICD-10-CM | POA: Insufficient documentation

## 2017-10-25 DIAGNOSIS — M797 Fibromyalgia: Secondary | ICD-10-CM | POA: Diagnosis not present

## 2017-10-25 DIAGNOSIS — I10 Essential (primary) hypertension: Secondary | ICD-10-CM | POA: Insufficient documentation

## 2017-10-25 DIAGNOSIS — Z87891 Personal history of nicotine dependence: Secondary | ICD-10-CM | POA: Insufficient documentation

## 2017-10-25 DIAGNOSIS — Z79899 Other long term (current) drug therapy: Secondary | ICD-10-CM | POA: Insufficient documentation

## 2017-10-25 DIAGNOSIS — M5136 Other intervertebral disc degeneration, lumbar region: Secondary | ICD-10-CM | POA: Insufficient documentation

## 2017-10-25 DIAGNOSIS — E785 Hyperlipidemia, unspecified: Secondary | ICD-10-CM | POA: Diagnosis not present

## 2017-10-25 DIAGNOSIS — J45909 Unspecified asthma, uncomplicated: Secondary | ICD-10-CM | POA: Diagnosis not present

## 2017-10-25 DIAGNOSIS — F418 Other specified anxiety disorders: Secondary | ICD-10-CM | POA: Diagnosis not present

## 2017-10-25 DIAGNOSIS — Z7984 Long term (current) use of oral hypoglycemic drugs: Secondary | ICD-10-CM | POA: Insufficient documentation

## 2017-10-25 DIAGNOSIS — N202 Calculus of kidney with calculus of ureter: Secondary | ICD-10-CM | POA: Diagnosis not present

## 2017-10-25 DIAGNOSIS — E282 Polycystic ovarian syndrome: Secondary | ICD-10-CM | POA: Diagnosis not present

## 2017-10-25 HISTORY — DX: Personal history of urinary (tract) infections: Z87.440

## 2017-10-25 HISTORY — DX: Fibromyalgia: M79.7

## 2017-10-25 HISTORY — PX: CYSTOSCOPY WITH RETROGRADE PYELOGRAM, URETEROSCOPY AND STENT PLACEMENT: SHX5789

## 2017-10-25 HISTORY — DX: Low back pain: M54.5

## 2017-10-25 HISTORY — DX: Headache: R51

## 2017-10-25 HISTORY — DX: Anxiety disorder, unspecified: F41.9

## 2017-10-25 HISTORY — DX: Unspecified asthma, uncomplicated: J45.909

## 2017-10-25 HISTORY — DX: Post-traumatic stress disorder, unspecified: F43.10

## 2017-10-25 HISTORY — DX: Spondylosis without myelopathy or radiculopathy, lumbar region: M47.816

## 2017-10-25 HISTORY — DX: Personal history of other diseases of urinary system: Z87.448

## 2017-10-25 HISTORY — DX: Hyperlipidemia, unspecified: E78.5

## 2017-10-25 HISTORY — DX: Meniere's disease, unspecified ear: H81.09

## 2017-10-25 HISTORY — DX: Other intervertebral disc degeneration, lumbar region without mention of lumbar back pain or lower extremity pain: M51.369

## 2017-10-25 HISTORY — DX: Other chronic suppurative otitis media, left ear: H66.3X2

## 2017-10-25 HISTORY — DX: Low back pain, unspecified: M54.50

## 2017-10-25 HISTORY — DX: Depression, unspecified: F32.A

## 2017-10-25 HISTORY — DX: Hidradenitis suppurativa: L73.2

## 2017-10-25 HISTORY — DX: Nausea with vomiting, unspecified: R11.2

## 2017-10-25 HISTORY — DX: Plantar fascial fibromatosis: M72.2

## 2017-10-25 HISTORY — DX: Edema of unspecified eye, unspecified eyelid: H02.849

## 2017-10-25 HISTORY — DX: Allergic rhinitis, unspecified: J30.9

## 2017-10-25 HISTORY — DX: Chronic pain syndrome: G89.4

## 2017-10-25 HISTORY — DX: Pain in unspecified joint: M25.50

## 2017-10-25 HISTORY — DX: Frequency of micturition: R35.0

## 2017-10-25 HISTORY — DX: Other specified postprocedural states: Z98.890

## 2017-10-25 HISTORY — DX: Hirsutism: L68.0

## 2017-10-25 HISTORY — DX: Dyspnea, unspecified: R06.00

## 2017-10-25 HISTORY — DX: Major depressive disorder, single episode, unspecified: F32.9

## 2017-10-25 HISTORY — DX: Other specified abnormal findings of blood chemistry: R79.89

## 2017-10-25 HISTORY — DX: Sacrococcygeal disorders, not elsewhere classified: M53.3

## 2017-10-25 HISTORY — DX: Spondylosis without myelopathy or radiculopathy, cervical region: M47.812

## 2017-10-25 HISTORY — PX: HOLMIUM LASER APPLICATION: SHX5852

## 2017-10-25 HISTORY — DX: Unspecified osteoarthritis, unspecified site: M19.90

## 2017-10-25 HISTORY — DX: Abnormal results of liver function studies: R94.5

## 2017-10-25 HISTORY — DX: Other intervertebral disc degeneration, lumbar region: M51.36

## 2017-10-25 HISTORY — DX: Headache, unspecified: R51.9

## 2017-10-25 HISTORY — DX: Chronic sinusitis, unspecified: J32.9

## 2017-10-25 LAB — POCT I-STAT, CHEM 8
BUN: 9 mg/dL (ref 6–20)
CALCIUM ION: 1.28 mmol/L (ref 1.15–1.40)
CHLORIDE: 102 mmol/L (ref 98–111)
Creatinine, Ser: 1 mg/dL (ref 0.44–1.00)
GLUCOSE: 88 mg/dL (ref 70–99)
HCT: 35 % — ABNORMAL LOW (ref 36.0–46.0)
Hemoglobin: 11.9 g/dL — ABNORMAL LOW (ref 12.0–15.0)
Potassium: 4.4 mmol/L (ref 3.5–5.1)
Sodium: 141 mmol/L (ref 135–145)
TCO2: 26 mmol/L (ref 22–32)

## 2017-10-25 LAB — POCT PREGNANCY, URINE: PREG TEST UR: NEGATIVE

## 2017-10-25 LAB — GLUCOSE, CAPILLARY: Glucose-Capillary: 89 mg/dL (ref 70–99)

## 2017-10-25 SURGERY — CYSTOURETEROSCOPY, WITH RETROGRADE PYELOGRAM AND STENT INSERTION
Anesthesia: General | Site: Ureter | Laterality: Bilateral

## 2017-10-25 MED ORDER — LIDOCAINE 2% (20 MG/ML) 5 ML SYRINGE
INTRAMUSCULAR | Status: DC | PRN
  Administered 2017-10-25: 60 mg via INTRAVENOUS

## 2017-10-25 MED ORDER — MIDAZOLAM HCL 5 MG/5ML IJ SOLN
INTRAMUSCULAR | Status: DC | PRN
  Administered 2017-10-25: 2 mg via INTRAVENOUS

## 2017-10-25 MED ORDER — OXYCODONE HCL 5 MG PO TABS
ORAL_TABLET | ORAL | Status: AC
  Filled 2017-10-25: qty 1

## 2017-10-25 MED ORDER — ONDANSETRON HCL 4 MG/2ML IJ SOLN
INTRAMUSCULAR | Status: DC | PRN
  Administered 2017-10-25: 4 mg via INTRAVENOUS

## 2017-10-25 MED ORDER — SODIUM CHLORIDE 0.9 % IR SOLN
Status: DC | PRN
  Administered 2017-10-25: 3000 mL

## 2017-10-25 MED ORDER — IOHEXOL 300 MG/ML  SOLN
INTRAMUSCULAR | Status: DC | PRN
  Administered 2017-10-25: 17 mL

## 2017-10-25 MED ORDER — CEPHALEXIN 500 MG PO CAPS: 500.0000 mg | ORAL_CAPSULE | Freq: Two times a day (BID) | ORAL | 0 refills | Status: DC

## 2017-10-25 MED ORDER — FENTANYL CITRATE (PF) 100 MCG/2ML IJ SOLN
INTRAMUSCULAR | Status: AC
  Filled 2017-10-25: qty 2

## 2017-10-25 MED ORDER — PROMETHAZINE HCL 25 MG/ML IJ SOLN
INTRAMUSCULAR | Status: AC
  Filled 2017-10-25: qty 1

## 2017-10-25 MED ORDER — LACTATED RINGERS IV SOLN
INTRAVENOUS | Status: DC
  Administered 2017-10-25: 16:00:00 via INTRAVENOUS
  Filled 2017-10-25: qty 1000

## 2017-10-25 MED ORDER — PROPOFOL 10 MG/ML IV BOLUS
INTRAVENOUS | Status: DC | PRN
  Administered 2017-10-25: 200 mg via INTRAVENOUS

## 2017-10-25 MED ORDER — GENTAMICIN SULFATE 40 MG/ML IJ SOLN
320.0000 mg | Freq: Once | INTRAVENOUS | Status: AC
  Administered 2017-10-25: 320 mg via INTRAVENOUS
  Filled 2017-10-25: qty 8

## 2017-10-25 MED ORDER — OXYCODONE HCL 5 MG PO TABS
5.0000 mg | ORAL_TABLET | Freq: Once | ORAL | Status: AC | PRN
  Administered 2017-10-25: 5 mg via ORAL
  Filled 2017-10-25: qty 1

## 2017-10-25 MED ORDER — ONDANSETRON HCL 4 MG/2ML IJ SOLN
INTRAMUSCULAR | Status: AC
  Filled 2017-10-25: qty 2

## 2017-10-25 MED ORDER — OXYCODONE-ACETAMINOPHEN 5-325 MG PO TABS: 1.0000 | ORAL_TABLET | Freq: Four times a day (QID) | ORAL | 0 refills | Status: DC | PRN

## 2017-10-25 MED ORDER — LIDOCAINE 2% (20 MG/ML) 5 ML SYRINGE
INTRAMUSCULAR | Status: AC
  Filled 2017-10-25: qty 5

## 2017-10-25 MED ORDER — MIDAZOLAM HCL 2 MG/2ML IJ SOLN
INTRAMUSCULAR | Status: AC
  Filled 2017-10-25: qty 2

## 2017-10-25 MED ORDER — DEXAMETHASONE SODIUM PHOSPHATE 10 MG/ML IJ SOLN
INTRAMUSCULAR | Status: AC
  Filled 2017-10-25: qty 1

## 2017-10-25 MED ORDER — FENTANYL CITRATE (PF) 100 MCG/2ML IJ SOLN
INTRAMUSCULAR | Status: DC | PRN
  Administered 2017-10-25 (×5): 25 ug via INTRAVENOUS

## 2017-10-25 MED ORDER — LACTATED RINGERS IV SOLN
INTRAVENOUS | Status: DC | PRN
  Administered 2017-10-25: 13:00:00 via INTRAVENOUS

## 2017-10-25 MED ORDER — FENTANYL CITRATE (PF) 100 MCG/2ML IJ SOLN
25.0000 ug | INTRAMUSCULAR | Status: DC | PRN
  Administered 2017-10-25: 50 ug via INTRAVENOUS
  Filled 2017-10-25: qty 1

## 2017-10-25 MED ORDER — PROMETHAZINE HCL 25 MG/ML IJ SOLN
6.2500 mg | INTRAMUSCULAR | Status: DC | PRN
  Administered 2017-10-25: 6.25 mg via INTRAVENOUS
  Filled 2017-10-25: qty 1

## 2017-10-25 MED ORDER — DEXAMETHASONE SODIUM PHOSPHATE 10 MG/ML IJ SOLN
INTRAMUSCULAR | Status: DC | PRN
  Administered 2017-10-25: 10 mg via INTRAVENOUS

## 2017-10-25 MED ORDER — OXYCODONE HCL 5 MG/5ML PO SOLN
5.0000 mg | Freq: Once | ORAL | Status: AC | PRN
  Filled 2017-10-25: qty 5

## 2017-10-25 SURGICAL SUPPLY — 28 items
BAG DRAIN URO-CYSTO SKYTR STRL (DRAIN) ×4 IMPLANT
BASKET LASER NITINOL 1.9FR (BASKET) ×4 IMPLANT
CATH INTERMIT  6FR 70CM (CATHETERS) ×4 IMPLANT
CLOTH BEACON ORANGE TIMEOUT ST (SAFETY) ×4 IMPLANT
DRSG TEGADERM 2-3/8X2-3/4 SM (GAUZE/BANDAGES/DRESSINGS) ×4 IMPLANT
FIBER LASER TRAC TIP (UROLOGICAL SUPPLIES) ×4 IMPLANT
GLOVE BIO SURGEON STRL SZ 6.5 (GLOVE) ×3 IMPLANT
GLOVE BIO SURGEON STRL SZ7.5 (GLOVE) ×4 IMPLANT
GLOVE BIO SURGEONS STRL SZ 6.5 (GLOVE) ×1
GLOVE BIOGEL PI IND STRL 6.5 (GLOVE) ×2 IMPLANT
GLOVE BIOGEL PI INDICATOR 6.5 (GLOVE) ×2
GOWN STRL REUS W/TWL LRG LVL3 (GOWN DISPOSABLE) ×8 IMPLANT
GUIDEWIRE ANG ZIPWIRE 038X150 (WIRE) ×4 IMPLANT
GUIDEWIRE STR DUAL SENSOR (WIRE) ×4 IMPLANT
IV NS 1000ML (IV SOLUTION) ×2
IV NS 1000ML BAXH (IV SOLUTION) ×2 IMPLANT
IV NS IRRIG 3000ML ARTHROMATIC (IV SOLUTION) ×4 IMPLANT
KIT TURNOVER CYSTO (KITS) ×4 IMPLANT
MANIFOLD NEPTUNE II (INSTRUMENTS) ×4 IMPLANT
NS IRRIG 500ML POUR BTL (IV SOLUTION) ×4 IMPLANT
PACK CYSTO (CUSTOM PROCEDURE TRAY) ×4 IMPLANT
SHEATH ACCESS URETERAL 24CM (SHEATH) ×4 IMPLANT
STENT POLARIS 5FRX24 (STENTS) ×8 IMPLANT
SYR 10ML LL (SYRINGE) ×4 IMPLANT
TUBE CONNECTING 12'X1/4 (SUCTIONS) ×1
TUBE CONNECTING 12X1/4 (SUCTIONS) ×3 IMPLANT
TUBE FEEDING 8FR 16IN STR KANG (MISCELLANEOUS) IMPLANT
TUBING UROLOGY SET (TUBING) IMPLANT

## 2017-10-25 NOTE — Anesthesia Procedure Notes (Signed)
Procedure Name: LMA Insertion Date/Time: 10/25/2017 2:24 PM Performed by: Pearson Grippeobertson, Haevyn Ury M, CRNA Pre-anesthesia Checklist: Patient identified, Emergency Drugs available, Suction available and Patient being monitored Patient Re-evaluated:Patient Re-evaluated prior to induction Oxygen Delivery Method: Circle system utilized Preoxygenation: Pre-oxygenation with 100% oxygen Induction Type: IV induction Ventilation: Mask ventilation without difficulty LMA: LMA inserted LMA Size: 4.0 Number of attempts: 1 Placement Confirmation: positive ETCO2 Tube secured with: Tape Dental Injury: Teeth and Oropharynx as per pre-operative assessment

## 2017-10-25 NOTE — Op Note (Signed)
NAMIhor Dow: Klecker, Mckinzi MEDICAL RECORD [id number] ACCOUNT [id number] DATE OF BIRTH:[birth year] FACILITY: [facility] LOCATION: [facility] PHYSICIAN:Corbin Hott, MD  OPERATIVE REPORT  DATE OF PROCEDURE:  10/25/2017  PREOPERATIVE DIAGNOSES:  Left ureteral bilateral renal stones.  POSTOPERATIVE DIAGNOSIS:  Left ureteral bilateral renal stones, history of urosepsis.  PROCEDURE: 1.  Cystoscopy, bilateral pyelograms, interpretation. 2.  Exchange of bilateral ureteral stents 7 x 24 Polaris with tether. 3.  Bilateral ureteroscopy with laser lithotripsy.  ESTIMATED BLOOD LOSS:  Nil.    COMPLICATIONS:  None.  SPECIMENS:  Bilateral renal ureteral stone fragments.  Portion given to patient.  Portion for composition analysis.  FINDINGS: 1.  Antegrade progression of right renal stones, some within ureter, some within the right kidney. 2.  Left mid ureteral stone, left punctate intrarenal stones. 3.  Complete resolution of all stone fragments larger than 130 mm following laser lithotripsy and basket extraction. 4.  Successful replacement of the bilateral ureteral stents proximal renal pelvis, distally in urinary bladder.  INDICATIONS:  The patient is a pleasant 47 year old lady with some history of pain management comorbidity.  She was found on workup of fevers, urosepsis, to have a left ureteral stone, bilateral renal stones and fragments.  Recently, she underwent  bilateral stenting, at that time for renal decompression, intravenous antibiotics.  She has since cleared her infectious parameters completely.  She presents today for bilateral ureteroscopy with a goal of stone free.  Informed consent was then placed in  the medical record.  PROCEDURE IN DETAIL:  The patient being identified, procedure being bilateral arthroscopic stimulation was confirmed.  The procedure was carried out with the aforementioned.  Antibiotics administered.  General anesthesia introduced.  The patient was  placed into a  low-lithotomy position.  Genitals were prepped and draped at the vagina, introitus and proximal thighs using iodine.  Cystourethroscopy was performed with a 22-French rigid cystoscope offset lens.  Inspection of bladder revealed no  diverticula, calcifications, or papillary lesions.  The distal end of bilateral stents was seen exiting the ureteral orifices.  The distal end of the left stent was grasped and brought to the level of the urethral meatus.  A 0.038 ZIPwire was taken to  the level of the upper pole.  It was exchanged for an open-ended catheter, and left ventriculogram was obtained.  Left ventriculogram reveals a single left ureterocele in the left kidney.  There was a mobile filling defect in the midureter consistent with stone.  The ZIPwire was once again advanced to the upper pole and set aside as a safety wire.  Similarly, the  distal end of the right stent was grasped and brought to the urethral meatus.  Through this, a 0.038 ZIPwire was advanced in the lower pole.  This was exchanged for an open-ended catheter, and a right retrograde pyelogram was obtained.  A right retrograde pyelogram showed a single right ureter with a single-system right kidney.  No sign of infection or injury noted.  A separate ZIPwire was advanced into the upper pole on the right side, acting as a safety wire.  An 8-French feeding tube  was placed in the urinary bladder for pressure release, and a semirigid ureteroscopy was performed of the distal orifice of the right ureter alongside a sensor working wire.  There were 2 small foci of ureteral stones, each approximately 2 mm.  These  were amenable to basketing, removed and set aside for composition analysis.  The sensor wire was left in situ.  Next, camera-guided ureteroscopy was performed in the  distal orifice left ureter alongside a separate sensor working wire.  There were 2  ureteral stones in the midureter.  These appeared to be too large for simple basketing.  As  such, holmium laser energy was applied to the stones using settings of 0.3 joules and 30 Hz.  These were fragmented into pieces approximately 2 mm in diameter,  which were then sequentially grasped with their long axis, removed and set aside for composition analysis.  Semirigid ureteroscopy of the remainder of the orifice of the distal left ureter was unremarkable.  The semirigid scope was exchanged for a 12/14,  28 cm ureteral access sheath to the level of the proximal ureter using continuous fluoroscopic guidance.  Then, flexible digital ureteroscopy was performed in the proximal left ureter with systematic inspection of the left kidney.  There were several  papillary tip calcifications, approximately 3, each one of which was 2-3 mm in diameter.  These were amenable to basketing.  They were removed and set aside.  The access sheath was removed under continuous vision.  No mucosal abnormalities were found.   Similarly, the access sheath was placed over the right sensor working wire to the level of the proximal right ureter, and systematic inspection was performed of the proximal right ureter and right kidney, including all calices x3.  Similarly, there were  several foci of small papillary tip calcifications, approximately 3 of them, which were 2-3 mm in diameter.  These were amenable to basketing and removed.  The access sheath was then removed on the right side, and the mucosal abnormalities were found.   Given bilateral access sheath usage, it was felt that brief interval stenting would be warranted.  As such, a new 5 x 24 Polaris-type stent was placed over the remaining safety wire, using fluoroscopic guidance on each side.  The tether was left in  place, fashioned to the mons pubis bilaterally, and the procedure was terminated.  The patient tolerated the procedure well.  No immediate perioperative complications.  The patient was taken to the postanesthesia care unit in stable condition.  LN/NUANCE   D:10/25/2017 T:10/25/2017 JOB:[id number]

## 2017-10-25 NOTE — Transfer of Care (Signed)
Immediate Anesthesia Transfer of Care Note  Patient: Misty [name]  Procedure(s) Performed: CYSTOSCOPY WITH RETROGRADE PYELOGRAM, URETEROSCOPY AND STENT PLACEMENT (Bilateral Ureter) HOLMIUM LASER APPLICATION (Bilateral )  Patient Location: PACU  Anesthesia Type:General  Level of Consciousness: awake, alert  and oriented  Airway & Oxygen Therapy: Patient Spontanous Breathing and Patient connected to nasal cannula oxygen  Post-op Assessment: Report given to RN and Post -op [name] signs reviewed and stable  Post [name] signs: Reviewed and stable  Last Vitals:  Vitals Value Taken Time  BP 149/96 10/25/2017  3:14 PM  Temp    Pulse 115 10/25/2017  3:15 PM  Resp 12 10/25/2017  3:15 PM  SpO2 100 % 10/25/2017  3:15 PM  Vitals shown include unvalidated device data.  Last Pain:  Vitals:   10/25/17 1232  TempSrc:   PainSc: 7       Patients Stated Pain Goal: 5 (10/25/17 1232)  Complications: No apparent anesthesia complications

## 2017-10-25 NOTE — H&P (Signed)
Misty [name] is an 47 y.o. female.    Chief Complaint: Pre-op BILATERAL Ureteroscopic Stone Manipulation  HPI:   1- Urolithiasis - s/p urgent bilateral stent placement 10/01/17 in setting of obstructing pyelonephritis for Left 6mm distal and bilateral small renal stones.   UCX pan-sensitive e.coli and received appropriate IV to PO course.  Today "Misty [name]" is seen to proceed with BILATERAL ureteroscopic stone manipualtion with goal of stone free. She has been on CX-specific ABX pre-op as instructed. NO interval fevers.   Past Medical History:  Diagnosis Date  . Allergic rhinitis   . Anxiety   . Arthralgia of multiple joints   . Asthma   . Cervical spondylosis   . Chronic pain syndrome   . Chronic sinusitis   . Chronic suppurative otitis media of left ear   . DDD (degenerative disc disease), lumbar   . Depression   . Diabetes mellitus without complication (HCC)   . Dyspnea    with exertion  . Elevated LFTs   . Fibromyalgia   . Headache    tension/stress headaches  . Hidradenitis   . Hirsutism   . History of hematuria   . History of UTI   . Hyperlipidemia   . Hypertension   . Increased urinary frequency   . Insulin resistance   . Kidney stones   . Low back pain   . Lumbar spondylosis   . Meniere's disease   . OA (osteoarthritis)   . PCOS (polycystic ovarian syndrome)   . Plantar fasciitis   . PONV (postoperative nausea and vomiting)   . Psoriasis   . PTSD (post-traumatic stress disorder)   . SI (sacroiliac) pain   . Swelling of eyelid    Bilateral, occ to wear she can't open them    Past Surgical History:  Procedure Laterality Date  . AXILLARY HIDRADENITIS EXCISION Bilateral 03/30/2013  . CESAREAN SECTION     x3  . CYSTOSCOPY W/ URETERAL STENT PLACEMENT Left 10/01/2017   Procedure: CYSTOSCOPY WITH RETROGRADE PYELOGRAM/URETERAL STENT PLACEMENT;  Surgeon: Sebastian Ache, MD;  Location: [facility];  Service: Urology;  Laterality: Left;  . DENTAL SURGERY    . FINGER  SURGERY    . KIDNEY STONE SURGERY     3-4 times  . MYRINGOTOMY WITH TUBE PLACEMENT Bilateral   . SHOULDER SURGERY     x2  . TONSILLECTOMY      Family History  Problem Relation Age of Onset  . COPD Mother   . Hypertension Mother    Social History:  reports that she has quit smoking. She has never used smokeless tobacco. She reports that she drank alcohol. She reports that she does not use drugs.  Allergies:  Allergies  Allergen Reactions  . Niacin And Related Shortness Of Breath and Nausea And Vomiting  . Darvon [Propoxyphene] Other (See Comments)    Severe headache  . Lyrica [Pregabalin] Itching and Swelling    Facial, throat swelling  . Metronidazole Nausea And Vomiting    Felt weird   . Adhesive [Tape] Itching and Rash    Paper tape: Rash and itching if left on too long    No medications prior to admission.    No results found for this or any previous visit (from the past 48 hour(s)). No results found.  Review of Systems  Constitutional: Negative.  Negative for chills and fever.  HENT: Negative.   Eyes: Negative.   Respiratory: Negative.   Cardiovascular: Negative.   Genitourinary: Positive for urgency.  Musculoskeletal: Negative.  Skin: Negative.   Neurological: Negative.   Endo/Heme/Allergies: Negative.   Psychiatric/Behavioral: Negative.     Height 5\' 5"  (1.651 m), weight 74.8 kg (165 lb), last menstrual period 10/18/2017. Physical Exam  Constitutional: She appears well-developed.  HENT:  Head: Normocephalic.  Eyes: Pupils are equal, round, and reactive to light.  Neck: Normal range of motion.  Cardiovascular: Normal rate.  Respiratory: Effort normal.  GI: Soft.  Stable truncal obesity.   Genitourinary:  Genitourinary Comments: No CVAT at present.   Neurological: She is alert.  Skin: Skin is warm.     Assessment/Plan  Proceed as planned with BILATERAL ureteroscopic stone manipulation. Risks, benefits, alternatives, expected peri-op course  discussed previously and reiterated today.   Sebastian AcheMANNY, Chany Woolworth, MD 10/25/2017, 7:28 AM

## 2017-10-25 NOTE — Discharge Instructions (Signed)
1 - You may have urinary urgency (bladder spasms) and bloody urine on / off with stent in place. This is normal.  2 -Remove tethered stents on Monday morning at home by pulling on string, then blue-white plastic tubing and discarding. There are TWO stents. Office is open Monday if any issues arise.   3 - Call MD or go to ER for fever >102, severe pain / nausea / vomiting not relieved by medications, or acute change in medical status  Post Anesthesia Home Care Instructions  Activity: Get plenty of rest for the remainder of the day. A responsible individual must stay with you for 24 hours following the procedure.  For the next 24 hours, DO NOT: -Drive a car -Advertising copywriterperate machinery -Drink alcoholic beverages -Take any medication unless instructed by your physician -Make any legal decisions or sign important papers.  Meals: Start with liquid foods such as gelatin or soup. Progress to regular foods as tolerated. Avoid greasy, spicy, heavy foods. If nausea and/or vomiting occur, drink only clear liquids until the nausea and/or vomiting subsides. Call your physician if vomiting continues.  Special Instructions/Symptoms: Your throat may feel dry or sore from the anesthesia or the breathing tube placed in your throat during surgery. If this causes discomfort, gargle with warm salt water. The discomfort should disappear within 24 hours.  If you had a scopolamine patch placed behind your ear for the management of post- operative nausea and/or vomiting:  1. The medication in the patch is effective for 72 hours, after which it should be removed.  Wrap patch in a tissue and discard in the trash. Wash hands thoroughly with soap and water. 2. You may remove the patch earlier than 72 hours if you experience unpleasant side effects which may include dry mouth, dizziness or visual disturbances. 3. Avoid touching the patch. Wash your hands with soap and water after contact with the patch.

## 2017-10-25 NOTE — Anesthesia Postprocedure Evaluation (Signed)
Anesthesia Post Note  Patient: Misty [name]  Procedure(s) Performed: CYSTOSCOPY WITH RETROGRADE PYELOGRAM, URETEROSCOPY AND STENT PLACEMENT (Bilateral Ureter) HOLMIUM LASER APPLICATION (Bilateral )     Patient location during evaluation: PACU Anesthesia Type: General Level of consciousness: awake and alert Pain management: pain level controlled Vital Signs Assessment: post-procedure vital signs reviewed and stable Respiratory status: spontaneous breathing, nonlabored ventilation and respiratory function stable Cardiovascular status: blood pressure returned to baseline and stable Postop Assessment: no apparent nausea or vomiting Anesthetic complications: no    Last Vitals:  Vitals:   10/25/17 1545 10/25/17 1600  BP: (!) 145/85 (!) 144/89  Pulse: 72 86  Resp: 10 13  Temp:    SpO2: 100% 100%    Last Pain:  Vitals:   10/25/17 1548  TempSrc:   PainSc: 6                  Beryle Lathehomas E Brock

## 2017-10-25 NOTE — Brief Op Note (Signed)
10/25/2017  3:06 PM  PATIENT:  Misty [name]  47 y.o. female  PRE-OPERATIVE DIAGNOSIS:  BILATERAL URETERAL / RENAL STONES  POST-OPERATIVE DIAGNOSIS:  BILATERAL URETERAL / RENAL STONES  PROCEDURE:  Procedure(s): CYSTOSCOPY WITH RETROGRADE PYELOGRAM, URETEROSCOPY AND STENT PLACEMENT (Bilateral) HOLMIUM LASER APPLICATION (Bilateral)  SURGEON:  Surgeon(s) and Role:    Sebastian Ache* Earlene Bjelland, MD - Primary  PHYSICIAN ASSISTANT:   ASSISTANTS: none   ANESTHESIA:   general  EBL:  minimal   BLOOD ADMINISTERED:none  DRAINS: none   LOCAL MEDICATIONS USED:  NONE  SPECIMEN:  Source of Specimen:  bilateral renal/ureteral stone fragments  DISPOSITION OF SPECIMEN:  [facility] for compositional analysis  COUNTS:  YES  TOURNIQUET:  * No tourniquets in log *  DICTATION: .Other Dictation: Dictation Number [id number]  PLAN OF CARE: Discharge to home after PACU  PATIENT DISPOSITION:  PACU - hemodynamically stable.   Delay start of Pharmacological VTE agent (>24hrs) due to surgical blood loss or risk of bleeding: yes

## 2017-10-28 ENCOUNTER — Encounter (HOSPITAL_BASED_OUTPATIENT_CLINIC_OR_DEPARTMENT_OTHER): Payer: Self-pay | Admitting: Urology

## 2017-10-29 DIAGNOSIS — N202 Calculus of kidney with calculus of ureter: Secondary | ICD-10-CM | POA: Diagnosis not present

## 2017-11-08 DIAGNOSIS — N202 Calculus of kidney with calculus of ureter: Secondary | ICD-10-CM | POA: Diagnosis not present

## 2017-11-18 DIAGNOSIS — G894 Chronic pain syndrome: Secondary | ICD-10-CM | POA: Diagnosis not present

## 2017-11-18 DIAGNOSIS — M5136 Other intervertebral disc degeneration, lumbar region: Secondary | ICD-10-CM | POA: Diagnosis not present

## 2017-11-18 DIAGNOSIS — M533 Sacrococcygeal disorders, not elsewhere classified: Secondary | ICD-10-CM | POA: Diagnosis not present

## 2017-11-18 DIAGNOSIS — M2550 Pain in unspecified joint: Secondary | ICD-10-CM | POA: Diagnosis not present

## 2017-11-21 ENCOUNTER — Telehealth: Payer: Self-pay

## 2017-11-21 NOTE — Telephone Encounter (Signed)
Misty [name] with [facility] called; pt has new pt appt with Harlin Heys Gessner FNP on 12/25/17; Misty [name] wants to know if can schedule an acute visit prior to establishing pt to get metformin refilled. Looking at med list metformin is on med list with 2017 date but does not have a provider so not sure how long pt has been off metformin. Advised could not do an acute simple visit for this and pt would need to see PCP has now or go to UC. Misty [name] voiced understanding. FYI to Harlin Heys Gessner FNP.

## 2017-11-21 NOTE — Telephone Encounter (Signed)
Noted  

## 2017-12-01 DIAGNOSIS — N200 Calculus of kidney: Secondary | ICD-10-CM | POA: Diagnosis not present

## 2017-12-02 DIAGNOSIS — N200 Calculus of kidney: Secondary | ICD-10-CM | POA: Diagnosis not present

## 2017-12-20 DIAGNOSIS — E282 Polycystic ovarian syndrome: Secondary | ICD-10-CM | POA: Diagnosis not present

## 2017-12-20 DIAGNOSIS — E1165 Type 2 diabetes mellitus with hyperglycemia: Secondary | ICD-10-CM | POA: Diagnosis not present

## 2017-12-25 ENCOUNTER — Ambulatory Visit (INDEPENDENT_AMBULATORY_CARE_PROVIDER_SITE_OTHER): Payer: BLUE CROSS/BLUE SHIELD | Admitting: Family Medicine

## 2017-12-25 ENCOUNTER — Encounter: Payer: Self-pay | Admitting: Family Medicine

## 2017-12-25 VITALS — BP 136/78 | HR 114 | Temp 98.3°F | Ht 65.0 in | Wt 149.0 lb

## 2017-12-25 DIAGNOSIS — Z1321 Encounter for screening for nutritional disorder: Secondary | ICD-10-CM | POA: Diagnosis not present

## 2017-12-25 DIAGNOSIS — I10 Essential (primary) hypertension: Secondary | ICD-10-CM

## 2017-12-25 DIAGNOSIS — D649 Anemia, unspecified: Secondary | ICD-10-CM | POA: Diagnosis not present

## 2017-12-25 DIAGNOSIS — M533 Sacrococcygeal disorders, not elsewhere classified: Secondary | ICD-10-CM

## 2017-12-25 DIAGNOSIS — M5416 Radiculopathy, lumbar region: Secondary | ICD-10-CM

## 2017-12-25 DIAGNOSIS — E118 Type 2 diabetes mellitus with unspecified complications: Secondary | ICD-10-CM | POA: Diagnosis not present

## 2017-12-25 DIAGNOSIS — R634 Abnormal weight loss: Secondary | ICD-10-CM

## 2017-12-25 DIAGNOSIS — L987 Excessive and redundant skin and subcutaneous tissue: Secondary | ICD-10-CM | POA: Insufficient documentation

## 2017-12-25 DIAGNOSIS — M47816 Spondylosis without myelopathy or radiculopathy, lumbar region: Secondary | ICD-10-CM

## 2017-12-25 LAB — LIPID PANEL
Cholesterol: 203 mg/dL — ABNORMAL HIGH (ref 0–200)
HDL: 49.8 mg/dL
LDL Cholesterol: 114 mg/dL — ABNORMAL HIGH (ref 0–99)
NonHDL: 153.09
Total CHOL/HDL Ratio: 4
Triglycerides: 194 mg/dL — ABNORMAL HIGH (ref 0.0–149.0)
VLDL: 38.8 mg/dL (ref 0.0–40.0)

## 2017-12-25 LAB — CBC WITH DIFFERENTIAL/PLATELET
BASOS PCT: 1.1 % (ref 0.0–3.0)
Basophils Absolute: 0.1 10*3/uL (ref 0.0–0.1)
EOS PCT: 1.2 % (ref 0.0–5.0)
Eosinophils Absolute: 0.1 10*3/uL (ref 0.0–0.7)
HCT: 39.1 % (ref 36.0–46.0)
Hemoglobin: 13.3 g/dL (ref 12.0–15.0)
LYMPHS ABS: 1.3 10*3/uL (ref 0.7–4.0)
Lymphocytes Relative: 20.9 % (ref 12.0–46.0)
MCHC: 34 g/dL (ref 30.0–36.0)
MCV: 86.7 fl (ref 78.0–100.0)
MONO ABS: 0.4 10*3/uL (ref 0.1–1.0)
Monocytes Relative: 7 % (ref 3.0–12.0)
NEUTROS ABS: 4.2 10*3/uL (ref 1.4–7.7)
NEUTROS PCT: 69.8 % (ref 43.0–77.0)
Platelets: 301 10*3/uL (ref 150.0–400.0)
RBC: 4.51 Mil/uL (ref 3.87–5.11)
RDW: 14 % (ref 11.5–15.5)
WBC: 6 10*3/uL (ref 4.0–10.5)

## 2017-12-25 LAB — COMPREHENSIVE METABOLIC PANEL
ALK PHOS: 56 U/L (ref 39–117)
ALT: 14 U/L (ref 0–35)
AST: 18 U/L (ref 0–37)
Albumin: 4.8 g/dL (ref 3.5–5.2)
BUN: 13 mg/dL (ref 6–23)
CHLORIDE: 101 meq/L (ref 96–112)
CO2: 27 mEq/L (ref 19–32)
Calcium: 10.6 mg/dL — ABNORMAL HIGH (ref 8.4–10.5)
Creatinine, Ser: 1.2 mg/dL (ref 0.40–1.20)
GFR: 50.86 mL/min — ABNORMAL LOW (ref >60.00)
Glucose, Bld: 81 mg/dL (ref 70–99)
POTASSIUM: 4.7 meq/L (ref 3.5–5.1)
Sodium: 139 mEq/L (ref 135–145)
TOTAL PROTEIN: 8 g/dL (ref 6.0–8.3)
Total Bilirubin: 0.6 mg/dL (ref 0.2–1.2)

## 2017-12-25 LAB — VITAMIN B12: VITAMIN B 12: 708 pg/mL (ref 211–911)

## 2017-12-25 LAB — FERRITIN: Ferritin: 23.1 ng/mL (ref 10.0–291.0)

## 2017-12-25 LAB — TSH: TSH: 1.65 u[IU]/mL (ref 0.35–4.50)

## 2017-12-25 LAB — VITAMIN D 25 HYDROXY (VIT D DEFICIENCY, FRACTURES): VITD: 33.18 ng/mL (ref 30.00–100.00)

## 2017-12-25 LAB — MICROALBUMIN / CREATININE URINE RATIO
Creatinine,U: 200.3 mg/dL
Microalb Creat Ratio: 2 mg/g (ref 0.0–30.0)
Microalb, Ur: 3.9 mg/dL — ABNORMAL HIGH (ref 0.0–1.9)

## 2017-12-25 LAB — HEMOGLOBIN A1C: HEMOGLOBIN A1C: 5.1 % (ref 4.6–6.5)

## 2017-12-25 LAB — IBC PANEL
IRON: 54 ug/dL (ref 42–145)
Saturation Ratios: 13.2 % — ABNORMAL LOW (ref 20.0–50.0)
Transferrin: 292 mg/dL (ref 212.0–360.0)

## 2017-12-25 NOTE — Progress Notes (Signed)
Subjective:    Patient ID: Misty [name], female    DOB: 1970/11/19, 47 y.o.   MRN: [id number]  HPI This is a 47 yo female who presents today to establish care, accompanied by her husband, [name].    Last CPE- at least 2 years Mammo- a couple of years ago Pap- several years, regular periods Tdap- unknown Flu- declines Eye- this year Dental- over due Exercise- some yoga, exacerbates back pain. Can walk short distances.   Chronic back pain- currently seen at [facility] Management in [place]. Would like to get off pain medication if possible. Currently out of hydrocodone, will follow up with Pain Management. Had good results with buprenorphine patches, but had localized skin reaction and has gone to buccal formula which doesn't seem to work as well. Alleve 6-8 a day (she was advised to limit to no more than 4 per day). Would like second opinion for any possible surgical intervention.   DM- recently went to UC to get back on metformin. No testing done. Does not check blood sugars at home.   Obesity- used to weight over 250 pounds. Currently doesn't eat much, drinks chicken collagen. Denies abdominal pain, has some middle abdominal bloating. Had CT of abdomen/pelvis 10/01/17 which was essentially negative except for renal stones.   Skin- very sensitive skin, has history of hidradenitis suppurative and has had surgery/multiple treatments. Lesions currently well controlled, has lesions from axilla to groin. Improved with weight loss. Has psoriasis of scalp, currently ok.   ROS- No CP, some SOB with anxiety/pain, no diarrhea/constipaion (takes magnesium)/abdominal pain, no problems with urination currently.   Past Medical History:  Diagnosis Date  . Allergic rhinitis   . Anxiety   . Arthralgia of multiple joints   . Asthma   . Cervical spondylosis   . Chronic pain syndrome   . Chronic sinusitis   . Chronic suppurative otitis media of left ear   . DDD (degenerative disc disease), lumbar   .  Depression   . Diabetes mellitus without complication (HCC)   . Dyspnea    with exertion  . Elevated LFTs   . Fibromyalgia   . Headache    tension/stress headaches  . Hidradenitis   . Hirsutism   . History of hematuria   . History of UTI   . Hyperlipidemia   . Hypertension   . Increased urinary frequency   . Insulin resistance   . Kidney stones   . Low back pain   . Lumbar spondylosis   . Meniere's disease   . OA (osteoarthritis)   . PCOS (polycystic ovarian syndrome)   . Plantar fasciitis   . PONV (postoperative nausea and vomiting)   . Psoriasis   . PTSD (post-traumatic stress disorder)   . SI (sacroiliac) pain   . Swelling of eyelid    Bilateral, occ to wear she can't open them   Past Surgical History:  Procedure Laterality Date  . AXILLARY HIDRADENITIS EXCISION Bilateral 03/30/2013  . CESAREAN SECTION     x3  . CYSTOSCOPY W/ URETERAL STENT PLACEMENT Left 10/01/2017   Procedure: CYSTOSCOPY WITH RETROGRADE PYELOGRAM/URETERAL STENT PLACEMENT;  Surgeon: Sebastian Ache, MD;  Location: [facility];  Service: Urology;  Laterality: Left;  . CYSTOSCOPY WITH RETROGRADE PYELOGRAM, URETEROSCOPY AND STENT PLACEMENT Bilateral 10/25/2017   Procedure: CYSTOSCOPY WITH RETROGRADE PYELOGRAM, URETEROSCOPY AND STENT PLACEMENT;  Surgeon: Sebastian Ache, MD;  Location: [facility];  Service: Urology;  Laterality: Bilateral;  . DENTAL SURGERY    . FINGER SURGERY    .  HOLMIUM LASER APPLICATION Bilateral 10/25/2017   Procedure: HOLMIUM LASER APPLICATION;  Surgeon: Sebastian AcheManny, Theodore, MD;  [facility];  Service: Urology;  Laterality: Bilateral;  . KIDNEY STONE SURGERY     3-4 times  . MYRINGOTOMY WITH TUBE PLACEMENT Bilateral   . SHOULDER SURGERY     x2  . TONSILLECTOMY     Family History  Problem Relation Age of Onset  . COPD Mother   . Hypertension Mother    Social History   Tobacco Use  . Smoking status: Former Games developermoker  . Smokeless tobacco: Never  Used  . Tobacco comment: 2-3 cigarettes per day 3 years; quit 2 years ago  Substance Use Topics  . Alcohol use: Not Currently    Alcohol/week: 0.0 standard drinks    Comment: occasional   . Drug use: No      Review of Systems Per HPI    Objective:   Physical Exam  Constitutional: She is oriented to person, place, and time.  This, appears stated age.   HENT:  Head: Normocephalic and atraumatic.  Top of head with small bony ridge. Nontender, no erythema.   Eyes: Conjunctivae are normal.  Pulmonary/Chest: Effort normal.  Neurological: She is alert and oriented to person, place, and time.  Skin: Skin is warm and dry. No pallor.  Legs with multiple well healed small scars. Right wrist with small, linear vesicles.   Psychiatric: She has a normal mood and affect. Her behavior is normal. Judgment and thought content normal.  Intermittently tearful during interview.   Vitals reviewed.     BP 136/78 (BP Location: Left Arm, Patient Position: Sitting, Cuff Size: Normal)   Pulse (!) 114   Temp 98.3 F (36.8 C) (Oral)   Ht 5\' 5"  (1.651 m)   Wt 149 lb (67.6 kg)   SpO2 98%   BMI 24.79 kg/m  Wt Readings from Last 3 Encounters:  12/25/17 149 lb (67.6 kg)  10/21/17 165 lb (74.8 kg)  10/01/17 165 lb (74.8 kg)   Depression screen PHQ 2/9 12/25/2017  Decreased Interest 3  Down, Depressed, Hopeless 3  PHQ - 2 Score 6  Altered sleeping 3  Tired, decreased energy 3  Change in appetite 3  Feeling bad or failure about yourself  3  Trouble concentrating 1  PHQ-9 Score 19       Assessment & Plan:  1. Controlled type 2 diabetes mellitus with complication, without long-term current use of insulin (HCC) - no recent A1C - Hemoglobin A1c - Comprehensive metabolic panel - CBC with Differential - Vitamin B12 - Microalbumin / creatinine urine ratio - Lipid Panel  2. Essential hypertension - well controlled today, not on any meds for HTN - CBC with Differential - Lipid Panel  3.  Anemia, unspecified type - Vitamin B12 - IBC panel - Ferritin  4. Encounter for vitamin deficiency screening - Vitamin D, 25-hydroxy  5. Lumbar spondylosis - Ambulatory referral to Orthopedic Surgery  6. Lumbar radicular pain - Ambulatory referral to Orthopedic Surgery  7. SI (sacroiliac) pain - Ambulatory referral to Orthopedic Surgery  8. Weight loss - decreased appetite and depressed - TSH  - follow up in 3 months for CPE, have provided her information to schedule mammogram  Olean Reeeborah Gessner, FNP-BC  [facility]  12/25/2017 1:13 PM

## 2017-12-25 NOTE — Patient Instructions (Signed)
Good to see you today  Please call and schedule an appointment for screening mammogram. A referral is not needed.  [phone] [facility]- [phone]  [phone]- [phone]  Please schedule a complete physical exam for 3-4 months  I have put in a referral to neurosurgery for a second opinion  I will send you a mychart message about your lab work

## 2017-12-31 DIAGNOSIS — N202 Calculus of kidney with calculus of ureter: Secondary | ICD-10-CM | POA: Diagnosis not present

## 2018-01-15 ENCOUNTER — Ambulatory Visit (INDEPENDENT_AMBULATORY_CARE_PROVIDER_SITE_OTHER): Payer: Self-pay

## 2018-01-15 ENCOUNTER — Ambulatory Visit (INDEPENDENT_AMBULATORY_CARE_PROVIDER_SITE_OTHER): Payer: BLUE CROSS/BLUE SHIELD | Admitting: Orthopaedic Surgery

## 2018-01-15 ENCOUNTER — Encounter (INDEPENDENT_AMBULATORY_CARE_PROVIDER_SITE_OTHER): Payer: Self-pay | Admitting: Orthopaedic Surgery

## 2018-01-15 VITALS — BP 133/93 | HR 106 | Ht 64.0 in | Wt 145.0 lb

## 2018-01-15 DIAGNOSIS — M545 Low back pain: Secondary | ICD-10-CM | POA: Diagnosis not present

## 2018-01-15 DIAGNOSIS — G8929 Other chronic pain: Secondary | ICD-10-CM | POA: Diagnosis not present

## 2018-01-15 DIAGNOSIS — M542 Cervicalgia: Secondary | ICD-10-CM | POA: Diagnosis not present

## 2018-01-15 NOTE — Progress Notes (Signed)
Office Visit Note   Patient: Misty [name]           Date of Birth: [birth year]           MRN: [id number] Visit Date: 01/15/2018              Requested by: Emi Belfast, FNP [address] [place], [place] [address] PCP: Emi Belfast, FNP   Assessment & Plan: Visit Diagnoses:  1. Neck pain   2. Chronic bilateral low back pain, with sciatica presence unspecified     Plan: I recommend obtaining an MRI scan for chronic low back pain with chronic narcotic medication for several years.  No imaging studies noted in the last 3 years.  Office follow-up after MRI scan for review.  W per therapeutic recommendations for anti-inflammatories since patient states sometimes she is taken 16 ibuprofen per day.  E discussed avoiding  Follow-Up Instructions: No follow-ups on file.   Orders:  Orders Placed This Encounter  Procedures  . XR Cervical Spine 2 or 3 views  . XR Lumbar Spine 2-3 Views   No orders of the defined types were placed in this encounter.     Procedures: No procedures performed   Clinical Data: No additional findings.   Subjective: Chief Complaint  Patient presents with  . Neck - Pain  . Lower Back - Pain    HPI 47 year old female seen with complaints of chronic low back pain which she localizes over the sacroiliac joint.  She states she is been told she is had sacroiliitis and has had previous injections.  She is had knee injections and pain management and is on buprenorphine 150 mcg film also hydrocodone 10/325 in the past.  Patient states her toes and fingers go numb turn white she has pictures showing Raynaud's phenomena both feet and palms.  Previous MRI 3 years ago.  She states that she is lost about 150 pounds with exercise program over several years.  She is had multiple epidurals as well as sacroiliac joint injections.  Review of Systems positive for PCOS, hypertension, history UTI, pyelonephritis, renal stones, otitis media, chronic pain  management on buprenorphine currently.  14 point review of systems otherwise negative.   Objective: Vital Signs: BP (!) 133/93   Pulse (!) 106   Ht 5\' 4"  (1.626 m)   Wt 145 lb (65.8 kg)   BMI 24.89 kg/m   Physical Exam  Constitutional: She is oriented to person, place, and time. She appears well-developed.  HENT:  Head: Normocephalic.  Right Ear: External ear normal.  Left Ear: External ear normal.  Eyes: Pupils are equal, round, and reactive to light.  Neck: No tracheal deviation present. No thyromegaly present.  Cardiovascular: Normal rate.  Pulmonary/Chest: Effort normal.  Abdominal: Soft.  Neurological: She is alert and oriented to person, place, and time.  Skin: Skin is warm and dry.  Psychiatric: She has a normal mood and affect. Her behavior is normal.    Ortho Exam patient have negative logroll to the hips knee and ankle jerk are intact.  She has some tenderness over the sacroiliac joints both right and left mild sciatic notch tenderness.  Anterior tib gastrocsoleus is active.  No plantar foot lesions.  Pedal pulses are intact.  Negative Faber test right and left.  Specialty Comments:  No specialty comments available.  Imaging: No results found.   PMFS History: Patient Active Problem List   Diagnosis Date Noted  . Excess skin of thigh 12/25/2017  .  Excess skin of arm 12/25/2017  . Stone, kidney 10/01/2017  . PCOS (polycystic ovarian syndrome) 06/27/2015  . HTN (hypertension) 06/27/2015  . UTI (urinary tract infection) 06/27/2015  . Pyelonephritis 06/27/2015  . Chronic otitis media 06/27/2015  . Sepsis (HCC) 06/26/2015   Past Medical History:  Diagnosis Date  . Allergic rhinitis   . Anxiety   . Arthralgia of multiple joints   . Asthma   . Cervical spondylosis   . Chronic pain syndrome   . Chronic sinusitis   . Chronic suppurative otitis media of left ear   . DDD (degenerative disc disease), lumbar   . Depression   . Diabetes mellitus without  complication (HCC)   . Dyspnea    with exertion  . Elevated LFTs   . Fibromyalgia   . Headache    tension/stress headaches  . Hidradenitis   . Hirsutism   . History of hematuria   . History of UTI   . Hyperlipidemia   . Hypertension   . Increased urinary frequency   . Insulin resistance   . Kidney stones   . Low back pain   . Lumbar spondylosis   . Meniere's disease   . OA (osteoarthritis)   . PCOS (polycystic ovarian syndrome)   . Plantar fasciitis   . PONV (postoperative nausea and vomiting)   . Psoriasis   . PTSD (post-traumatic stress disorder)   . SI (sacroiliac) pain   . Swelling of eyelid    Bilateral, occ to wear she can't open them    Family History  Problem Relation Age of Onset  . COPD Mother   . Hypertension Mother   . Arthritis Maternal Grandmother     Past Surgical History:  Procedure Laterality Date  . AXILLARY HIDRADENITIS EXCISION Bilateral 03/30/2013  . CESAREAN SECTION     x3  . CESAREAN SECTION  [phone],1995  . CYSTOSCOPY W/ URETERAL STENT PLACEMENT Left 10/01/2017   Procedure: CYSTOSCOPY WITH RETROGRADE PYELOGRAM/URETERAL STENT PLACEMENT;  Surgeon: Sebastian AcheManny, Theodore, MD;  Location: [facility];  Service: Urology;  Laterality: Left;  . CYSTOSCOPY WITH RETROGRADE PYELOGRAM, URETEROSCOPY AND STENT PLACEMENT Bilateral 10/25/2017   Procedure: CYSTOSCOPY WITH RETROGRADE PYELOGRAM, URETEROSCOPY AND STENT PLACEMENT;  Surgeon: Sebastian AcheManny, Theodore, MD;  [facility];  Service: Urology;  Laterality: Bilateral;  . DENTAL SURGERY    . FINGER SURGERY    . HOLMIUM LASER APPLICATION Bilateral 10/25/2017   Procedure: HOLMIUM LASER APPLICATION;  Surgeon: Sebastian AcheManny, Theodore, MD;  [facility];  Service: Urology;  Laterality: Bilateral;  . KIDNEY STONE SURGERY     3-4 times  . MYRINGOTOMY WITH TUBE PLACEMENT Bilateral   . SHOULDER SURGERY     x2  . TONSILLECTOMY     Social History   Occupational History  . Not on file    Tobacco Use  . Smoking status: Former Games developermoker  . Smokeless tobacco: Never Used  . Tobacco comment: 2-3 cigarettes per day 3 years; quit 2 years ago  Substance and Sexual Activity  . Alcohol use: Not Currently    Alcohol/week: 0.0 standard drinks    Comment: occasional   . Drug use: No  . Sexual activity: Not on file    Comment: Vasectomy

## 2018-01-17 ENCOUNTER — Encounter (INDEPENDENT_AMBULATORY_CARE_PROVIDER_SITE_OTHER): Payer: Self-pay | Admitting: Orthopaedic Surgery

## 2018-01-31 ENCOUNTER — Other Ambulatory Visit (INDEPENDENT_AMBULATORY_CARE_PROVIDER_SITE_OTHER): Payer: Self-pay | Admitting: Orthopaedic Surgery

## 2018-01-31 DIAGNOSIS — M545 Low back pain, unspecified: Secondary | ICD-10-CM

## 2018-02-07 ENCOUNTER — Ambulatory Visit
Admission: RE | Admit: 2018-02-07 | Discharge: 2018-02-07 | Disposition: A | Discharge status: Home / Self Care | Payer: BLUE CROSS/BLUE SHIELD | Source: Ambulatory Visit | Attending: Orthopaedic Surgery | Admitting: Orthopaedic Surgery

## 2018-02-07 DIAGNOSIS — M545 Low back pain, unspecified: Secondary | ICD-10-CM

## 2018-02-07 DIAGNOSIS — M48061 Spinal stenosis, lumbar region without neurogenic claudication: Secondary | ICD-10-CM | POA: Diagnosis not present

## 2018-02-12 ENCOUNTER — Encounter (INDEPENDENT_AMBULATORY_CARE_PROVIDER_SITE_OTHER): Payer: Self-pay | Admitting: Orthopaedic Surgery

## 2018-02-12 ENCOUNTER — Ambulatory Visit (INDEPENDENT_AMBULATORY_CARE_PROVIDER_SITE_OTHER): Payer: BLUE CROSS/BLUE SHIELD | Admitting: Orthopaedic Surgery

## 2018-02-12 VITALS — BP 146/95 | HR 124 | Ht 64.0 in | Wt 145.0 lb

## 2018-02-12 DIAGNOSIS — M5126 Other intervertebral disc displacement, lumbar region: Secondary | ICD-10-CM | POA: Diagnosis not present

## 2018-02-12 NOTE — Progress Notes (Signed)
Office Visit Note   Patient: Misty [name]           Date of Birth: 18-Aug-1970           MRN: [id number] Visit Date: 02/12/2018              Requested by: Emi Belfast, FNP [address] [place], [place] [address] PCP: Emi Belfast, FNP   Assessment & Plan: Visit Diagnoses:  1. Protrusion of lumbar intervertebral disc           Mild left foraminal disc protrusion L4-5 without compression unchanged from MRI 2015.  Plan: We reviewed the MRI scan as well as CT scan previously that showed normal sacroiliac joints.  .  I recommend she see a psychiatrist she previously saw for treatment for her depression.  We discussed some of her pain is related to the long-term narcotic usage that she is been taking more than 2 to 3 years.  No indications for operative intervention at this time.  When she tried depression medication in the past she states she had problems with it.  She recall Paxil is one that gave her problems.  We discussed relationship between pain and depression.  Chronic pain and narcotic usage was discussed.  Follow-Up Instructions: Return if symptoms worsen or fail to improve.   Orders:  No orders of the defined types were placed in this encounter.  No orders of the defined types were placed in this encounter.     Procedures: No procedures performed   Clinical Data: No additional findings.   Subjective: Chief Complaint  Patient presents with  . Lower Back - Follow-up    HPI 47 year old female returns with chronic back pain localized over the left sacroiliac joint.  He also has pain in her knees had some problems with her shoulder which outstretched reaching overhead activities.  New MRI scan lumbar is available for review.  This was obtained to make sure she did not have any significant compressive lesions.  After last visit she states she stopped her pain medication both the [place] film and norco 10/325 for 2 weeks and states she did not have any  withdrawal symptoms and did not have any change in her pain.  She was on 40 mEq/day morphine equivalents per [facility] website review.  I discussed with her at this point no indications for operative intervention.  Encouraged her work on exercise program, and seek treatment for depression.  Review of Systems review of systems updated unchanged from 01/15/2018.   Objective: Vital Signs: BP (!) 146/95   Pulse (!) 124   Ht 5\' 4"  (1.626 m)   Wt 145 lb (65.8 kg)   BMI 24.89 kg/m   Physical Exam  Constitutional: She is oriented to person, place, and time. She appears well-developed.  HENT:  Head: Normocephalic.  Right Ear: External ear normal.  Left Ear: External ear normal.  Eyes: Pupils are equal, round, and reactive to light.  Neck: No tracheal deviation present. No thyromegaly present.  Cardiovascular: Normal rate.  Pulmonary/Chest: Effort normal.  Abdominal: Soft.  Neurological: She is alert and oriented to person, place, and time.  Skin: Skin is warm and dry.  Psychiatric: She has a normal mood and affect. Her behavior is normal.  Patient's alert oriented nonsedated, conversant and talkative.  Ortho Exam patient gets from sitting standing normal heel toe gait.  No knee effusion noted.  She has some pain with overhead outstretched reaching right arm.  Normal  hip external rotation negative figure-of-four test.  She crosses her legs easily.  No rash over exposed skin.  Specialty Comments:  No specialty comments available.  Imaging: CLINICAL DATA:  Low back pain  EXAM: MRI LUMBAR SPINE WITHOUT CONTRAST  TECHNIQUE: Multiplanar, multisequence MR imaging of the lumbar spine was performed. No intravenous contrast was administered.  COMPARISON:  Lumbar MRI 02/22/2014  FINDINGS: Segmentation:  Normal  Alignment:  Normal  Vertebrae:  Normal bone marrow.  Negative for fracture or mass.  Conus medullaris and cauda equina: Conus extends to the L1-2 level. Conus and cauda equina  appear normal.  Paraspinal and other soft tissues: Paraspinous soft tissues normal.  Disc levels:  L1-2: Mild disc and facet degeneration with Schmorl's nodes. Negative for stenosis.  L2-3: Mild disc and mild facet degeneration.  Negative for stenosis.  L3-4: Mild disc and mild facet degeneration. Mild spinal stenosis unchanged from the prior study. Negative for disc protrusion.  L4-5: Mild disc and facet degeneration. Shallow left foraminal disc protrusion similar to the prior study. Mild spinal stenosis. Mild subarticular and foraminal stenosis on the left unchanged.  L5-S1: Bilateral facet degeneration.  Negative for stenosis.  IMPRESSION: Multilevel degenerative changes.  No change from the MRI of 2015.  Small left foraminal disc protrusion at L4-5 unchanged. Mild spinal stenosis and mild subarticular stenosis L4-5 unchanged.   Electronically Signed   By: Marlan Palau M.D.   On: 02/10/2018 08:48    PMFS History: Patient Active Problem List   Diagnosis Date Noted  . Excess skin of thigh 12/25/2017  . Excess skin of arm 12/25/2017  . Stone, kidney 10/01/2017  . PCOS (polycystic ovarian syndrome) 06/27/2015  . HTN (hypertension) 06/27/2015  . UTI (urinary tract infection) 06/27/2015  . Pyelonephritis 06/27/2015  . Chronic otitis media 06/27/2015  . Sepsis (HCC) 06/26/2015   Past Medical History:  Diagnosis Date  . Allergic rhinitis   . Anxiety   . Arthralgia of multiple joints   . Asthma   . Cervical spondylosis   . Chronic pain syndrome   . Chronic sinusitis   . Chronic suppurative otitis media of left ear   . DDD (degenerative disc disease), lumbar   . Depression   . Diabetes mellitus without complication (HCC)   . Dyspnea    with exertion  . Elevated LFTs   . Fibromyalgia   . Headache    tension/stress headaches  . Hidradenitis   . Hirsutism   . History of hematuria   . History of UTI   . Hyperlipidemia   . Hypertension   .  Increased urinary frequency   . Insulin resistance   . Kidney stones   . Low back pain   . Lumbar spondylosis   . Meniere's disease   . OA (osteoarthritis)   . PCOS (polycystic ovarian syndrome)   . Plantar fasciitis   . PONV (postoperative nausea and vomiting)   . Psoriasis   . PTSD (post-traumatic stress disorder)   . SI (sacroiliac) pain   . Swelling of eyelid    Bilateral, occ to wear she can't open them    Family History  Problem Relation Age of Onset  . COPD Mother   . Hypertension Mother   . Arthritis Maternal Grandmother     Past Surgical History:  Procedure Laterality Date  . AXILLARY HIDRADENITIS EXCISION Bilateral 03/30/2013  . CESAREAN SECTION     x3  . CESAREAN SECTION  [phone]  . CYSTOSCOPY W/ URETERAL STENT PLACEMENT Left 10/01/2017  Procedure: CYSTOSCOPY WITH RETROGRADE PYELOGRAM/URETERAL STENT PLACEMENT;  Surgeon: Sebastian Ache, MD;  Location: [facility];  Service: Urology;  Laterality: Left;  . CYSTOSCOPY WITH RETROGRADE PYELOGRAM, URETEROSCOPY AND STENT PLACEMENT Bilateral 10/25/2017   Procedure: CYSTOSCOPY WITH RETROGRADE PYELOGRAM, URETEROSCOPY AND STENT PLACEMENT;  Surgeon: Sebastian Ache, MD;  [facility];  Service: Urology;  Laterality: Bilateral;  . DENTAL SURGERY    . FINGER SURGERY    . HOLMIUM LASER APPLICATION Bilateral 10/25/2017   Procedure: HOLMIUM LASER APPLICATION;  Surgeon: Sebastian Ache, MD;  [facility];  Service: Urology;  Laterality: Bilateral;  . KIDNEY STONE SURGERY     3-4 times  . MYRINGOTOMY WITH TUBE PLACEMENT Bilateral   . SHOULDER SURGERY     x2  . TONSILLECTOMY     Social History   Occupational History  . Not on file  Tobacco Use  . Smoking status: Former Games developer  . Smokeless tobacco: Never Used  . Tobacco comment: 2-3 cigarettes per day 3 years; quit 2 years ago  Substance and Sexual Activity  . Alcohol use: Not Currently    Alcohol/week: 0.0 standard drinks     Comment: occasional   . Drug use: No  . Sexual activity: Not on file    Comment: Vasectomy

## 2018-02-13 DIAGNOSIS — M47816 Spondylosis without myelopathy or radiculopathy, lumbar region: Secondary | ICD-10-CM | POA: Diagnosis not present

## 2018-02-13 DIAGNOSIS — M47812 Spondylosis without myelopathy or radiculopathy, cervical region: Secondary | ICD-10-CM | POA: Diagnosis not present

## 2018-02-13 DIAGNOSIS — M533 Sacrococcygeal disorders, not elsewhere classified: Secondary | ICD-10-CM | POA: Diagnosis not present

## 2018-02-13 DIAGNOSIS — G894 Chronic pain syndrome: Secondary | ICD-10-CM | POA: Diagnosis not present

## 2018-02-17 ENCOUNTER — Ambulatory Visit: Payer: BLUE CROSS/BLUE SHIELD | Admitting: Family Medicine

## 2018-02-25 DIAGNOSIS — M533 Sacrococcygeal disorders, not elsewhere classified: Secondary | ICD-10-CM | POA: Diagnosis not present

## 2018-02-26 ENCOUNTER — Ambulatory Visit: Payer: BLUE CROSS/BLUE SHIELD | Admitting: Family Medicine

## 2018-03-17 ENCOUNTER — Other Ambulatory Visit: Payer: Self-pay | Admitting: Family Medicine

## 2018-03-17 DIAGNOSIS — R944 Abnormal results of kidney function studies: Secondary | ICD-10-CM

## 2018-03-21 ENCOUNTER — Ambulatory Visit (INDEPENDENT_AMBULATORY_CARE_PROVIDER_SITE_OTHER)
Admission: RE | Admit: 2018-03-21 | Discharge: 2018-03-21 | Disposition: A | Discharge status: Home / Self Care | Payer: BLUE CROSS/BLUE SHIELD | Source: Ambulatory Visit | Attending: Family Medicine | Admitting: Family Medicine

## 2018-03-21 ENCOUNTER — Ambulatory Visit (INDEPENDENT_AMBULATORY_CARE_PROVIDER_SITE_OTHER): Payer: BLUE CROSS/BLUE SHIELD | Admitting: Family Medicine

## 2018-03-21 ENCOUNTER — Encounter: Payer: Self-pay | Admitting: Family Medicine

## 2018-03-21 VITALS — BP 136/86 | HR 126 | Temp 97.8°F | Ht 64.0 in | Wt 141.1 lb

## 2018-03-21 DIAGNOSIS — R630 Anorexia: Secondary | ICD-10-CM | POA: Diagnosis not present

## 2018-03-21 DIAGNOSIS — M25511 Pain in right shoulder: Secondary | ICD-10-CM

## 2018-03-21 DIAGNOSIS — G8929 Other chronic pain: Secondary | ICD-10-CM

## 2018-03-21 DIAGNOSIS — E282 Polycystic ovarian syndrome: Secondary | ICD-10-CM

## 2018-03-21 DIAGNOSIS — G4701 Insomnia due to medical condition: Secondary | ICD-10-CM | POA: Diagnosis not present

## 2018-03-21 DIAGNOSIS — R944 Abnormal results of kidney function studies: Secondary | ICD-10-CM | POA: Diagnosis not present

## 2018-03-21 DIAGNOSIS — F339 Major depressive disorder, recurrent, unspecified: Secondary | ICD-10-CM

## 2018-03-21 MED ORDER — MIRTAZAPINE 7.5 MG PO TABS: 7.5000 mg | ORAL_TABLET | Freq: Every day | ORAL | 0 refills | Status: DC

## 2018-03-21 MED ORDER — METFORMIN HCL 850 MG PO TABS: 850.0000 mg | ORAL_TABLET | Freq: Two times a day (BID) | ORAL | 1 refills | Status: DC

## 2018-03-21 NOTE — Patient Instructions (Signed)
Good to see you today  Please go to lab and xray  I have sent in a low dose medication for you to take at night to help with sleep, appetite and mood  Mirtazapine tablets What is this medicine? MIRTAZAPINE (mir TAZ a peen) is used to treat depression. This medicine may be used for other purposes; ask your health care provider or pharmacist if you have questions. COMMON BRAND NAME(S): Remeron What should I tell my health care provider before I take this medicine? They need to know if you have any of these conditions: -bipolar disorder -glaucoma -kidney disease -liver disease -suicidal thoughts -an unusual or allergic reaction to mirtazapine, other medicines, foods, dyes, or preservatives -pregnant or trying to get pregnant -breast-feeding How should I use this medicine? Take this medicine by mouth with a glass of water. Follow the directions on the prescription label. Take your medicine at regular intervals. Do not take your medicine more often than directed. Do not stop taking this medicine suddenly except upon the advice of your doctor. Stopping this medicine too quickly may cause serious side effects or your condition may worsen. A special MedGuide will be given to you by the pharmacist with each prescription and refill. Be sure to read this information carefully each time. Talk to your pediatrician regarding the use of this medicine in children. Special care may be needed. Overdosage: If you think you have taken too much of this medicine contact a poison control center or emergency room at once. NOTE: This medicine is only for you. Do not share this medicine with others. What if I miss a dose? If you miss a dose, take it as soon as you can. If it is almost time for your next dose, take only that dose. Do not take double or extra doses. What may interact with this medicine? Do not take this medicine with any of the following medications: -linezolid -MAOIs like Carbex, Eldepryl,  Marplan, Nardil, and Parnate -methylene blue (injected into a vein) This medicine may also interact with the following medications: -alcohol -antiviral medicines for HIV or AIDS -certain medicines that treat or prevent blood clots like warfarin -certain medicines for depression, anxiety, or psychotic disturbances -certain medicines for fungal infections like ketoconazole and itraconazole -certain medicines for migraine headache like almotriptan, eletriptan, frovatriptan, naratriptan, rizatriptan, sumatriptan, zolmitriptan -certain medicines for seizures like carbamazepine or phenytoin -certain medicines for sleep -cimetidine -erythromycin -fentanyl -lithium -medicines for blood pressure -nefazodone -rasagiline -rifampin -supplements like St. John's wort, kava kava, valerian -tramadol -tryptophan This list may not describe all possible interactions. Give your health care provider a list of all the medicines, herbs, non-prescription drugs, or dietary supplements you use. Also tell them if you smoke, drink alcohol, or use illegal drugs. Some items may interact with your medicine. What should I watch for while using this medicine? Tell your doctor if your symptoms do not get better or if they get worse. Visit your doctor or health care professional for regular checks on your progress. Because it may take several weeks to see the full effects of this medicine, it is important to continue your treatment as prescribed by your doctor. Patients and their families should watch out for new or worsening thoughts of suicide or depression. Also watch out for sudden changes in feelings such as feeling anxious, agitated, panicky, irritable, hostile, aggressive, impulsive, severely restless, overly excited and hyperactive, or not being able to sleep. If this happens, especially at the beginning of treatment or after a change  in dose, call your health care professional. Bonita QuinYou may get drowsy or dizzy. Do not  drive, use machinery, or do anything that needs mental alertness until you know how this medicine affects you. Do not stand or sit up quickly, especially if you are an older patient. This reduces the risk of dizzy or fainting spells. Alcohol may interfere with the effect of this medicine. Avoid alcoholic drinks. This medicine may cause dry eyes and blurred vision. If you wear contact lenses you may feel some discomfort. Lubricating drops may help. See your eye doctor if the problem does not go away or is severe. Your mouth may get dry. Chewing sugarless gum or sucking hard candy, and drinking plenty of water may help. Contact your doctor if the problem does not go away or is severe. What side effects may I notice from receiving this medicine? Side effects that you should report to your doctor or health care professional as soon as possible: -allergic reactions like skin rash, itching or hives, swelling of the face, lips, or tongue -anxious -changes in vision -chest pain -confusion -elevated mood, decreased need for sleep, racing thoughts, impulsive behavior -eye pain -fast, irregular heartbeat -feeling faint or lightheaded, falls -feeling agitated, angry, or irritable -fever or chills, sore throat -hallucination, loss of contact with reality -loss of balance or coordination -mouth sores -redness, blistering, peeling or loosening of the skin, including inside the mouth -restlessness, pacing, inability to keep still -seizures -stiff muscles -suicidal thoughts or other mood changes -trouble passing urine or change in the amount of urine -trouble sleeping -unusual bleeding or bruising -unusually weak or tired -vomiting Side effects that usually do not require medical attention (report to your doctor or health care professional if they continue or are bothersome): -change in appetite -constipation -dizziness -dry mouth -muscle aches or pains -nausea -tired -weight gain This list may  not describe all possible side effects. Call your doctor for medical advice about side effects. You may report side effects to FDA at 1-800-FDA-1088. Where should I keep my medicine? Keep out of the reach of children. Store at room temperature between 15 and 30 degrees C (59 and 86 degrees F) Protect from light and moisture. Throw away any unused medicine after the expiration date. NOTE: This sheet is a summary. It may not cover all possible information. If you have questions about this medicine, talk to your doctor, pharmacist, or health care provider.  2018 Elsevier/Gold Standard (2015-09-15 17:30:45)

## 2018-03-21 NOTE — Progress Notes (Signed)
Subjective:    Patient ID: Misty [name], female    DOB: 1971/01/16, 47 y.o.   MRN: [id number]  HPI This is a 47 yo female, accompanied by her husband, who presents today with several concerns.   Worsening right shoulder pain. Had surgery x 15 years. She had her rotator cuff cut and some bone removed. Shoulder feels inflammed, burning from clavicle to upper arm. Has had decreased ROM over the last couple of months. No known trauma or overuse. Initially a fall to begin with.    PCOS- has increased facial hair, irregular periods off metformin. She reports good control on metfomin 850 mg bid, states that lower dose did not control her symptoms.   Had CAH- congenital adrenal hyperplasia diagnosis in the past, thinks it was actually her PCOS. Did not have any labs to confirm.   ROS- poor energy, some headaches recently- frontal, no chest pain or SOB.   Mood poor with chronic medical conditions and pain, little appetite.    Past Medical History:  Diagnosis Date  . Allergic rhinitis   . Anxiety   . Arthralgia of multiple joints   . Asthma   . Cervical spondylosis   . Chronic pain syndrome   . Chronic sinusitis   . Chronic suppurative otitis media of left ear   . DDD (degenerative disc disease), lumbar   . Depression   . Diabetes mellitus without complication (HCC)   . Dyspnea    with exertion  . Elevated LFTs   . Fibromyalgia   . Headache    tension/stress headaches  . Hidradenitis   . Hirsutism   . History of hematuria   . History of UTI   . Hyperlipidemia   . Hypertension   . Increased urinary frequency   . Insulin resistance   . Kidney stones   . Low back pain   . Lumbar spondylosis   . Meniere's disease   . OA (osteoarthritis)   . PCOS (polycystic ovarian syndrome)   . Plantar fasciitis   . PONV (postoperative nausea and vomiting)   . Psoriasis   . PTSD (post-traumatic stress disorder)   . SI (sacroiliac) pain   . Swelling of eyelid    Bilateral, occ to wear she  can't open them   Past Surgical History:  Procedure Laterality Date  . AXILLARY HIDRADENITIS EXCISION Bilateral 03/30/2013  . CESAREAN SECTION     x3  . CESAREAN SECTION  [phone]  . CYSTOSCOPY W/ URETERAL STENT PLACEMENT Left 10/01/2017   Procedure: CYSTOSCOPY WITH RETROGRADE PYELOGRAM/URETERAL STENT PLACEMENT;  Surgeon: Sebastian Ache, MD;  Location: [facility];  Service: Urology;  Laterality: Left;  . CYSTOSCOPY WITH RETROGRADE PYELOGRAM, URETEROSCOPY AND STENT PLACEMENT Bilateral 10/25/2017   Procedure: CYSTOSCOPY WITH RETROGRADE PYELOGRAM, URETEROSCOPY AND STENT PLACEMENT;  Surgeon: Sebastian Ache, MD;  Location: [facility];  Service: Urology;  Laterality: Bilateral;  . DENTAL SURGERY    . FINGER SURGERY    . HOLMIUM LASER APPLICATION Bilateral 10/25/2017   Procedure: HOLMIUM LASER APPLICATION;  Surgeon: Sebastian Ache, MD;  Location: [facility];  Service: Urology;  Laterality: Bilateral;  . KIDNEY STONE SURGERY     3-4 times  . MYRINGOTOMY WITH TUBE PLACEMENT Bilateral   . SHOULDER SURGERY     x2  . TONSILLECTOMY     Family History  Problem Relation Age of Onset  . COPD Mother   . Hypertension Mother   . Arthritis Maternal Grandmother    Social History  Tobacco Use  . Smoking status: Former Games developermoker  . Smokeless tobacco: Never Used  . Tobacco comment: 2-3 cigarettes per day 3 years; quit 2 years ago  Substance Use Topics  . Alcohol use: Not Currently    Alcohol/week: 0.0 standard drinks    Comment: occasional   . Drug use: No      Review of Systems Per HPI    Objective:   Physical Exam  Constitutional: She appears well-developed and well-nourished.  HENT:  Head: Normocephalic and atraumatic.  Eyes: Conjunctivae are normal.  Cardiovascular: Normal rate, regular rhythm and normal heart sounds.  Pulmonary/Chest: Effort normal and breath sounds normal.  Musculoskeletal: She exhibits no edema.       Right shoulder: She exhibits  decreased range of motion, tenderness, bony tenderness (clavicle) and deformity. She exhibits no swelling.  Vitals reviewed.     BP 136/86 (BP Location: Left Arm, Patient Position: Sitting, Cuff Size: Normal)   Pulse (!) 126   Temp 97.8 F (36.6 C) (Oral)   Ht 5\' 4"  (1.626 m)   Wt 141 lb 1.9 oz (64 kg)   LMP 03/17/2018   SpO2 99%   BMI 24.22 kg/m  Wt Readings from Last 3 Encounters:  03/21/18 141 lb 1.9 oz (64 kg)  02/12/18 145 lb (65.8 kg)  01/15/18 145 lb (65.8 kg)       Assessment & Plan:  1. Decreased GFR - Comprehensive metabolic panel  2. Chronic right shoulder pain - will get xray and consider ortho referral - DG Shoulder Right; Future  3. Decreased appetite - mirtazapine (REMERON) 7.5 MG tablet; Take 1 tablet (7.5 mg total) by mouth at bedtime.  Dispense: 90 tablet; Refill: 0  4. Insomnia due to medical condition - mirtazapine (REMERON) 7.5 MG tablet; Take 1 tablet (7.5 mg total) by mouth at bedtime.  Dispense: 90 tablet; Refill: 0  5. Major depression, recurrent, chronic (HCC) - mirtazapine (REMERON) 7.5 MG tablet; Take 1 tablet (7.5 mg total) by mouth at bedtime.  Dispense: 90 tablet; Refill: 0  6. PCOS (polycystic ovarian syndrome) - reminded her of possible SE - metFORMIN (GLUCOPHAGE) 850 MG tablet; Take 1 tablet (850 mg total) by mouth 2 (two) times daily with a meal.  Dispense: 180 tablet; Refill: 1  - Follow up on file  Olean Reeeborah Doyne Micke, FNP-BC  [facility], [facility]  03/22/2018 7:41 AM

## 2018-03-22 ENCOUNTER — Encounter: Payer: Self-pay | Admitting: Family Medicine

## 2018-03-22 DIAGNOSIS — M179 Osteoarthritis of knee, unspecified: Secondary | ICD-10-CM | POA: Insufficient documentation

## 2018-03-22 DIAGNOSIS — L659 Nonscarring hair loss, unspecified: Secondary | ICD-10-CM | POA: Insufficient documentation

## 2018-03-22 DIAGNOSIS — L409 Psoriasis, unspecified: Secondary | ICD-10-CM | POA: Insufficient documentation

## 2018-03-22 DIAGNOSIS — L987 Excessive and redundant skin and subcutaneous tissue: Secondary | ICD-10-CM | POA: Insufficient documentation

## 2018-03-22 DIAGNOSIS — R200 Anesthesia of skin: Secondary | ICD-10-CM | POA: Insufficient documentation

## 2018-03-22 DIAGNOSIS — M1710 Unilateral primary osteoarthritis, unspecified knee: Secondary | ICD-10-CM | POA: Insufficient documentation

## 2018-03-22 DIAGNOSIS — R202 Paresthesia of skin: Secondary | ICD-10-CM

## 2018-03-22 DIAGNOSIS — R55 Syncope and collapse: Secondary | ICD-10-CM | POA: Insufficient documentation

## 2018-03-22 LAB — COMPREHENSIVE METABOLIC PANEL
AG Ratio: 1.6 (calc) (ref 1.0–2.5)
ALT: 16 U/L (ref 6–29)
AST: 14 U/L (ref 10–35)
Albumin: 4.5 g/dL (ref 3.6–5.1)
Alkaline phosphatase (APISO): 61 U/L (ref 33–115)
BUN / CREAT RATIO: 12 (calc) (ref 6–22)
BUN: 16 mg/dL (ref 7–25)
CO2: 28 mmol/L (ref 20–32)
CREATININE: 1.3 mg/dL — AB (ref 0.50–1.10)
Calcium: 10.3 mg/dL — ABNORMAL HIGH (ref 8.6–10.2)
Chloride: 100 mmol/L (ref 98–110)
GLUCOSE: 84 mg/dL (ref 65–99)
Globulin: 2.8 g/dL (calc) (ref 1.9–3.7)
Potassium: 4.1 mmol/L (ref 3.5–5.3)
Sodium: 138 mmol/L (ref 135–146)
Total Bilirubin: 0.4 mg/dL (ref 0.2–1.2)
Total Protein: 7.3 g/dL (ref 6.1–8.1)

## 2018-03-24 NOTE — Addendum Note (Signed)
Addended by: Olean ReeGESSNER, DEBORAH B on: 03/24/2018 08:30 AM   Modules accepted: Orders

## 2018-03-26 ENCOUNTER — Other Ambulatory Visit: Payer: BLUE CROSS/BLUE SHIELD

## 2018-03-31 ENCOUNTER — Other Ambulatory Visit (HOSPITAL_COMMUNITY)
Admission: RE | Admit: 2018-03-31 | Discharge: 2018-03-31 | Disposition: A | Discharge status: Home / Self Care | Payer: BLUE CROSS/BLUE SHIELD | Source: Ambulatory Visit | Attending: Family Medicine | Admitting: Family Medicine

## 2018-03-31 ENCOUNTER — Encounter: Payer: Self-pay | Admitting: Family Medicine

## 2018-03-31 ENCOUNTER — Ambulatory Visit (INDEPENDENT_AMBULATORY_CARE_PROVIDER_SITE_OTHER): Payer: BLUE CROSS/BLUE SHIELD | Admitting: Family Medicine

## 2018-03-31 VITALS — BP 120/82 | HR 118 | Temp 97.6°F | Ht 64.0 in | Wt 148.0 lb

## 2018-03-31 DIAGNOSIS — Z1239 Encounter for other screening for malignant neoplasm of breast: Secondary | ICD-10-CM

## 2018-03-31 DIAGNOSIS — R7989 Other specified abnormal findings of blood chemistry: Secondary | ICD-10-CM | POA: Diagnosis not present

## 2018-03-31 DIAGNOSIS — Z124 Encounter for screening for malignant neoplasm of cervix: Secondary | ICD-10-CM | POA: Diagnosis not present

## 2018-03-31 DIAGNOSIS — Z23 Encounter for immunization: Secondary | ICD-10-CM

## 2018-03-31 DIAGNOSIS — Z0000 Encounter for general adult medical examination without abnormal findings: Secondary | ICD-10-CM

## 2018-03-31 NOTE — Progress Notes (Signed)
Subjective:    Patient ID: Misty [name], female    DOB: 1970-11-13, 47 y.o.   MRN: [id number]  HPI This is a 47 yo female who presents today for CPE. She is accompanied by her grown daughter.   Last CPE- several years ago Mammo- over due Pap- unknown Tdap- unknown, will have today Flu- declines Eye- annually Dental- over due Exercise- little due to pain  Chronic pain- sees pain management [place], PA-C) , has been struggling with buprenophine film- splits in mouth, she is afraid to swallow it. Also takes hydrocodone-acetaminophen 10-325, 1-2 tablets daily prn. She would like to get off of this, but pain is too persistent. She also takes ibuprofen 200 mg 8-10 tablets daily.   Depression/insomnia- was prescribed Remeron at her last office visit but she did not take. Read that it could cause weight gain and she does not want to gain weight although she and her husband had talked of her decreased appetite.   Past Medical History:  Diagnosis Date  . Allergic rhinitis   . Anxiety   . Arthralgia of multiple joints   . Asthma   . Cervical spondylosis   . Chronic pain syndrome   . Chronic sinusitis   . Chronic suppurative otitis media of left ear   . DDD (degenerative disc disease), lumbar   . Depression   . Diabetes mellitus without complication (HCC)   . Dyspnea    with exertion  . Elevated LFTs   . Fibromyalgia   . Headache    tension/stress headaches  . Hidradenitis   . Hirsutism   . History of hematuria   . History of UTI   . Hyperlipidemia   . Hypertension   . Increased urinary frequency   . Insulin resistance   . Kidney stones   . Low back pain   . Lumbar spondylosis   . Meniere's disease   . OA (osteoarthritis)   . PCOS (polycystic ovarian syndrome)   . Plantar fasciitis   . PONV (postoperative nausea and vomiting)   . Psoriasis   . PTSD (post-traumatic stress disorder)   . SI (sacroiliac) pain   . Swelling of eyelid    Bilateral, occ to wear  she can't open them   Past Surgical History:  Procedure Laterality Date  . AXILLARY HIDRADENITIS EXCISION Bilateral 03/30/2013  . CESAREAN SECTION     x3  . CESAREAN SECTION  [id number]  . CYSTOSCOPY W/ URETERAL STENT PLACEMENT Left 10/01/2017   Procedure: CYSTOSCOPY WITH RETROGRADE PYELOGRAM/URETERAL STENT PLACEMENT;  Surgeon: Sebastian Ache, MD;  Location: [facility];  Service: Urology;  Laterality: Left;  . CYSTOSCOPY WITH RETROGRADE PYELOGRAM, URETEROSCOPY AND STENT PLACEMENT Bilateral 10/25/2017   Procedure: CYSTOSCOPY WITH RETROGRADE PYELOGRAM, URETEROSCOPY AND STENT PLACEMENT;  Surgeon: Sebastian Ache, MD;  Location[facility];  Service: Urology;  Laterality: Bilateral;  . DENTAL SURGERY    . FINGER SURGERY    . HOLMIUM LASER APPLICATION Bilateral 10/25/2017   Procedure: HOLMIUM LASER APPLICATION;  Surgeon: Sebastian Ache, MD;  Location: [facility];  Service: Urology;  Laterality: Bilateral;  . KIDNEY STONE SURGERY     3-4 times  . MYRINGOTOMY WITH TUBE PLACEMENT Bilateral   . SHOULDER SURGERY     x2  . TONSILLECTOMY     Family History  Problem Relation Age of Onset  . COPD Mother   . Hypertension Mother   . Arthritis Maternal Grandmother    Social History   Tobacco Use  .  Smoking status: Former Games developer  . Smokeless tobacco: Never Used  . Tobacco comment: 2-3 cigarettes per day 3 years; quit 2 years ago  Substance Use Topics  . Alcohol use: Not Currently    Alcohol/week: 0.0 standard drinks    Comment: occasional   . Drug use: No        Review of Systems  Constitutional: Positive for fatigue (chronic with poor sleep, chronic pain and fibromyalgia).  HENT: Negative.   Eyes: Negative.   Respiratory: Negative.   Cardiovascular: Negative.   Endocrine: Negative.   Genitourinary: Negative.   Musculoskeletal: Positive for arthralgias and myalgias.  Skin: Negative.   Allergic/Immunologic: Negative.   Neurological: Negative.     Hematological: Bruises/bleeds easily.  Psychiatric/Behavioral: Positive for dysphoric mood and sleep disturbance. The patient is nervous/anxious.        Objective:   Physical Exam Physical Exam  Constitutional: She is oriented to person, place, and time. She appears well-developed and well-nourished. No distress.  HENT:  Head: Normocephalic and atraumatic.  Right Ear: External ear normal.  Left Ear: External ear normal.  Nose: Nose normal.  Mouth/Throat: Oropharynx is clear and moist. No oropharyngeal exudate.  Eyes: Conjunctivae are normal. Pupils are equal, round, and reactive to light.  Neck: Normal range of motion. Neck supple. No JVD present. No thyromegaly present.  Cardiovascular: Normal rate, regular rhythm, normal heart sounds and intact distal pulses.   Pulmonary/Chest: Effort normal and breath sounds normal. Right breast exhibits no inverted nipple, no mass, no nipple discharge, no skin change and no tenderness. Left breast exhibits no inverted nipple, no mass, no nipple discharge, no skin change and no tenderness. Breasts are symmetrical.  Abdominal: Soft. Bowel sounds are normal. She exhibits no distension and no mass. There is no tenderness. There is no rebound and no guarding.  Genitourinary: Vagina normal. Pelvic exam was performed with patient supine. There is no rash, tenderness, lesion or injury on the right labia. There is no rash, tenderness, lesion or injury on the left labia. Cervix exhibits no motion tenderness and no discharge. No vaginal discharge found.  Musculoskeletal: Normal range of motion. She exhibits no edema or tenderness.  Lymphadenopathy:    She has no cervical adenopathy.  Neurological: She is alert and oriented to person, place, and time. She has normal reflexes.  Skin: Skin is warm and dry. She is not diaphoretic. Excessive skin on abdomen, inner thighs, upper arms. Multiple areas of hyperpigmentation on upper thighs, axilla (? Prior sites  hidradenitis).  Psychiatric: She has a normal mood and affect. Her behavior is normal. Judgment and thought content normal.  Vitals reviewed.     BP 120/82 (BP Location: Left Arm, Patient Position: Sitting, Cuff Size: Normal)   Pulse (!) 118   Temp 97.6 F (36.4 C) (Oral)   Ht 5\' 4"  (1.626 m)   Wt 148 lb (67.1 kg)   LMP 03/17/2018   SpO2 96%   BMI 25.40 kg/m  Wt Readings from Last 3 Encounters:  03/31/18 148 lb (67.1 kg)  03/21/18 141 lb 1.9 oz (64 kg)  02/12/18 145 lb (65.8 kg)       Assessment & Plan:  1. Annual physical exam - Discussed and encouraged healthy lifestyle choices- adequate sleep, regular exercise, stress management and healthy food choices.   2. Elevated serum creatinine - suspect related to NSAIDs, discussed avoidance of all NSAIDs, can use acteminophen  3. Need for Tdap vaccination - Tdap vaccine greater than or equal to 7yo IM  4. Screening for cervical cancer - Cytology - PAP([place])  5. Screening for breast cancer - MM Digital Screening; Future  - follow up in 3 months  Olean Reeeborah Gessner, FNP-BC  [facility], [facility]  04/10/2018 6:59 AM

## 2018-03-31 NOTE — Patient Instructions (Addendum)
Please call and schedule an appointment for screening mammogram. [place]  Please try Remeron for sleep  Follow up in 3 months for labs/ office visit  Try 2 extra strength Tylenol every 8 to 12 hours  Health Maintenance, Female Adopting a healthy lifestyle and getting preventive care can go a long way to promote health and wellness. Talk with your health care provider about what schedule of regular examinations is right for you. This is a good chance for you to check in with your provider about disease prevention and staying healthy. In between checkups, there are plenty of things you can do on your own. Experts have done a lot of research about which lifestyle changes and preventive measures are most likely to keep you healthy. Ask your health care provider for more information. Weight and diet Eat a healthy diet  Be sure to include plenty of vegetables, fruits, low-fat dairy products, and lean protein.  Do not eat a lot of foods high in solid fats, added sugars, or salt.  Get regular exercise. This is one of the most important things you can do for your health. ? Most adults should exercise for at least 150 minutes each week. The exercise should increase your heart rate and make you sweat (moderate-intensity exercise). ? Most adults should also do strengthening exercises at least twice a week. This is in addition to the moderate-intensity exercise.  Maintain a healthy weight  Body mass index (BMI) is a measurement that can be used to identify possible weight problems. It estimates body fat based on height and weight. Your health care provider can help determine your BMI and help you achieve or maintain a healthy weight.  For females 23 years of age and older: ? A BMI below 18.5 is considered underweight. ? A BMI of 18.5 to 24.9 is normal. ? A BMI of 25 to 29.9 is considered overweight. ? A BMI of 30 and above is considered obese.  Watch  levels of cholesterol and blood lipids  You should start having your blood tested for lipids and cholesterol at 48 years of age, then have this test every 5 years.  You may need to have your cholesterol levels checked more often if: ? Your lipid or cholesterol levels are high. ? You are older than 48 years of age. ? You are at high risk for heart disease.  Cancer screening Lung Cancer  Lung cancer screening is recommended for adults 85-23 years old who are at high risk for lung cancer because of a history of smoking.  A yearly low-dose CT scan of the lungs is recommended for people who: ? Currently smoke. ? Have quit within the past 15 years. ? Have at least a 30-pack-year history of smoking. A pack year is smoking an average of one pack of cigarettes a day for 1 year.  Yearly screening should continue until it has been 15 years since you quit.  Yearly screening should stop if you develop a health problem that would prevent you from having lung cancer treatment.  Breast Cancer  Practice breast self-awareness. This means understanding how your breasts normally appear and feel.  It also means doing regular breast self-exams. Let your health care provider know about any changes, no matter how small.  If you are in your 20s or 30s, you should have a clinical breast exam (CBE) by a health care provider every 1-3 years as part of a regular health exam.  If you are  73 or older, have a CBE every year. Also consider having a breast X-ray (mammogram) every year.  If you have a family history of breast cancer, talk to your health care provider about genetic screening.  If you are at high risk for breast cancer, talk to your health care provider about having an MRI and a mammogram every year.  Breast cancer gene (BRCA) assessment is recommended for women who have family members with BRCA-related cancers. BRCA-related cancers include: ? Breast. ? Ovarian. ? Tubal. ? Peritoneal  cancers.  Results of the assessment will determine the need for genetic counseling and BRCA1 and BRCA2 testing.  Cervical Cancer Your health care provider may recommend that you be screened regularly for cancer of the pelvic organs (ovaries, uterus, and vagina). This screening involves a pelvic examination, including checking for microscopic changes to the surface of your cervix (Pap test). You may be encouraged to have this screening done every 3 years, beginning at age 14.  For women ages 34-65, health care providers may recommend pelvic exams and Pap testing every 3 years, or they may recommend the Pap and pelvic exam, combined with testing for human papilloma virus (HPV), every 5 years. Some types of HPV increase your risk of cervical cancer. Testing for HPV may also be done on women of any age with unclear Pap test results.  Other health care providers may not recommend any screening for nonpregnant women who are considered low risk for pelvic cancer and who do not have symptoms. Ask your health care provider if a screening pelvic exam is right for you.  If you have had past treatment for cervical cancer or a condition that could lead to cancer, you need Pap tests and screening for cancer for at least 20 years after your treatment. If Pap tests have been discontinued, your risk factors (such as having a new sexual partner) need to be reassessed to determine if screening should resume. Some women have medical problems that increase the chance of getting cervical cancer. In these cases, your health care provider may recommend more frequent screening and Pap tests.  Colorectal Cancer  This type of cancer can be detected and often prevented.  Routine colorectal cancer screening usually begins at 48 years of age and continues through 48 years of age.  Your health care provider may recommend screening at an earlier age if you have risk factors for colon cancer.  Your health care provider may also  recommend using home test kits to check for hidden blood in the stool.  A small camera at the end of a tube can be used to examine your colon directly (sigmoidoscopy or colonoscopy). This is done to check for the earliest forms of colorectal cancer.  Routine screening usually begins at age 8.  Direct examination of the colon should be repeated every 5-10 years through 48 years of age. However, you may need to be screened more often if early forms of precancerous polyps or small growths are found.  Skin Cancer  Check your skin from head to toe regularly.  Tell your health care provider about any new moles or changes in moles, especially if there is a change in a mole's shape or color.  Also tell your health care provider if you have a mole that is larger than the size of a pencil eraser.  Always use sunscreen. Apply sunscreen liberally and repeatedly throughout the day.  Protect yourself by wearing long sleeves, pants, a wide-brimmed hat, and sunglasses whenever you  are outside.  Heart disease, diabetes, and high blood pressure  High blood pressure causes heart disease and increases the risk of stroke. High blood pressure is more likely to develop in: ? People who have blood pressure in the high end of the normal range (130-139/85-89 mm Hg). ? People who are overweight or obese. ? People who are African American.  If you are 19-20 years of age, have your blood pressure checked every 3-5 years. If you are 49 years of age or older, have your blood pressure checked every year. You should have your blood pressure measured twice-once when you are at a hospital or clinic, and once when you are not at a hospital or clinic. Record the average of the two measurements. To check your blood pressure when you are not at a hospital or clinic, you can use: ? An automated blood pressure machine at a pharmacy. ? A home blood pressure monitor.  If you are between 73 years and 61 years old, ask your  health care provider if you should take aspirin to prevent strokes.  Have regular diabetes screenings. This involves taking a blood sample to check your fasting blood sugar level. ? If you are at a normal weight and have a low risk for diabetes, have this test once every three years after 48 years of age. ? If you are overweight and have a high risk for diabetes, consider being tested at a younger age or more often. Preventing infection Hepatitis B  If you have a higher risk for hepatitis B, you should be screened for this virus. You are considered at high risk for hepatitis B if: ? You were born in a country where hepatitis B is common. Ask your health care provider which countries are considered high risk. ? Your parents were born in a high-risk country, and you have not been immunized against hepatitis B (hepatitis B vaccine). ? You have HIV or AIDS. ? You use needles to inject street drugs. ? You live with someone who has hepatitis B. ? You have had sex with someone who has hepatitis B. ? You get hemodialysis treatment. ? You take certain medicines for conditions, including cancer, organ transplantation, and autoimmune conditions.  Hepatitis C  Blood testing is recommended for: ? Everyone born from [birth year] through 1966. ? Anyone with known risk factors for hepatitis C.  Sexually transmitted infections (STIs)  You should be screened for sexually transmitted infections (STIs) including gonorrhea and chlamydia if: ? You are sexually active and are younger than 48 years of age. ? You are older than 48 years of age and your health care provider tells you that you are at risk for this type of infection. ? Your sexual activity has changed since you were last screened and you are at an increased risk for chlamydia or gonorrhea. Ask your health care provider if you are at risk.  If you do not have HIV, but are at risk, it may be recommended that you take a prescription medicine daily to  prevent HIV infection. This is called pre-exposure prophylaxis (PrEP). You are considered at risk if: ? You are sexually active and do not regularly use condoms or know the HIV status of your partner(s). ? You take drugs by injection. ? You are sexually active with a partner who has HIV.  Talk with your health care provider about whether you are at high risk of being infected with HIV. If you choose to begin PrEP, you should first be  tested for HIV. You should then be tested every 3 months for as long as you are taking PrEP. Pregnancy  If you are premenopausal and you may become pregnant, ask your health care provider about preconception counseling.  If you may become pregnant, take 400 to 800 micrograms (mcg) of folic acid every day.  If you want to prevent pregnancy, talk to your health care provider about birth control (contraception). Osteoporosis and menopause  Osteoporosis is a disease in which the bones lose minerals and strength with aging. This can result in serious bone fractures. Your risk for osteoporosis can be identified using a bone density scan.  If you are 54 years of age or older, or if you are at risk for osteoporosis and fractures, ask your health care provider if you should be screened.  Ask your health care provider whether you should take a calcium or vitamin D supplement to lower your risk for osteoporosis.  Menopause may have certain physical symptoms and risks.  Hormone replacement therapy may reduce some of these symptoms and risks. Talk to your health care provider about whether hormone replacement therapy is right for you. Follow these instructions at home:  Schedule regular health, dental, and eye exams.  Stay current with your immunizations.  Do not use any tobacco products including cigarettes, chewing tobacco, or electronic cigarettes.  If you are pregnant, do not drink alcohol.  If you are breastfeeding, limit how much and how often you drink  alcohol.  Limit alcohol intake to no more than 1 drink per day for nonpregnant women. One drink equals 12 ounces of beer, 5 ounces of wine, or 1 ounces of hard liquor.  Do not use street drugs.  Do not share needles.  Ask your health care provider for help if you need support or information about quitting drugs.  Tell your health care provider if you often feel depressed.  Tell your health care provider if you have ever been abused or do not feel safe at home. This information is not intended to replace advice given to you by your health care provider. Make sure you discuss any questions you have with your health care provider. Document Released: 10/30/2010 Document Revised: 09/22/2015 Document Reviewed: 01/18/2015 Elsevier Interactive Patient Education  [employer].

## 2018-04-02 LAB — CYTOLOGY - PAP
Bacterial vaginitis: NEGATIVE
Candida vaginitis: NEGATIVE
Chlamydia: NEGATIVE
Diagnosis: NEGATIVE
HPV (WINDOPATH): NOT DETECTED
Neisseria Gonorrhea: NEGATIVE
TRICH (WINDOWPATH): NEGATIVE

## 2018-04-10 ENCOUNTER — Encounter: Payer: Self-pay | Admitting: Family Medicine

## 2018-04-10 ENCOUNTER — Other Ambulatory Visit: Payer: Self-pay | Admitting: Family Medicine

## 2018-04-10 DIAGNOSIS — R944 Abnormal results of kidney function studies: Secondary | ICD-10-CM

## 2018-04-10 NOTE — Progress Notes (Signed)
Ordered follow up BMET to assess kidney function in 1 month.

## 2018-05-14 DIAGNOSIS — M47816 Spondylosis without myelopathy or radiculopathy, lumbar region: Secondary | ICD-10-CM | POA: Diagnosis not present

## 2018-05-14 DIAGNOSIS — M2550 Pain in unspecified joint: Secondary | ICD-10-CM | POA: Diagnosis not present

## 2018-05-14 DIAGNOSIS — M5136 Other intervertebral disc degeneration, lumbar region: Secondary | ICD-10-CM | POA: Diagnosis not present

## 2018-05-14 DIAGNOSIS — M533 Sacrococcygeal disorders, not elsewhere classified: Secondary | ICD-10-CM | POA: Diagnosis not present

## 2018-06-03 DIAGNOSIS — M25561 Pain in right knee: Secondary | ICD-10-CM | POA: Diagnosis not present

## 2018-06-24 ENCOUNTER — Other Ambulatory Visit: Payer: Self-pay | Admitting: Family Medicine

## 2018-06-24 DIAGNOSIS — F339 Major depressive disorder, recurrent, unspecified: Secondary | ICD-10-CM

## 2018-06-24 DIAGNOSIS — R630 Anorexia: Secondary | ICD-10-CM

## 2018-06-24 DIAGNOSIS — G4701 Insomnia due to medical condition: Secondary | ICD-10-CM

## 2018-06-24 MED ORDER — MIRTAZAPINE 7.5 MG PO TABS: 7.5000 mg | ORAL_TABLET | Freq: Every day | ORAL | 0 refills | Status: DC

## 2018-06-24 NOTE — Telephone Encounter (Signed)
Refill sent.

## 2018-06-24 NOTE — Telephone Encounter (Signed)
Please advise on refill request below.

## 2018-06-30 ENCOUNTER — Ambulatory Visit: Payer: BLUE CROSS/BLUE SHIELD | Admitting: Family Medicine

## 2018-06-30 ENCOUNTER — Encounter: Payer: Self-pay | Admitting: Family Medicine

## 2018-06-30 VITALS — BP 100/78 | HR 100 | Temp 98.6°F | Resp 18 | Ht 64.0 in | Wt 146.4 lb

## 2018-06-30 DIAGNOSIS — R140 Abdominal distension (gaseous): Secondary | ICD-10-CM

## 2018-06-30 DIAGNOSIS — R109 Unspecified abdominal pain: Secondary | ICD-10-CM | POA: Diagnosis not present

## 2018-06-30 DIAGNOSIS — N852 Hypertrophy of uterus: Secondary | ICD-10-CM | POA: Diagnosis not present

## 2018-06-30 DIAGNOSIS — R944 Abnormal results of kidney function studies: Secondary | ICD-10-CM

## 2018-06-30 DIAGNOSIS — R935 Abnormal findings on diagnostic imaging of other abdominal regions, including retroperitoneum: Secondary | ICD-10-CM

## 2018-06-30 LAB — BASIC METABOLIC PANEL
BUN: 15 mg/dL (ref 6–23)
CO2: 28 meq/L (ref 19–32)
Calcium: 10.2 mg/dL (ref 8.4–10.5)
Chloride: 103 mEq/L (ref 96–112)
Creatinine, Ser: 1.1 mg/dL (ref 0.40–1.20)
GFR: 52.79 mL/min — ABNORMAL LOW (ref >60.00)
Glucose, Bld: 89 mg/dL (ref 70–99)
Potassium: 4.4 mEq/L (ref 3.5–5.1)
Sodium: 139 mEq/L (ref 135–145)

## 2018-06-30 LAB — POCT URINALYSIS DIPSTICK
Bilirubin, UA: NEGATIVE
Blood, UA: NEGATIVE
Glucose, UA: NEGATIVE
Ketones, UA: 0.2
Leukocytes, UA: NEGATIVE
Nitrite, UA: NEGATIVE
Protein, UA: NEGATIVE
Spec Grav, UA: 1.015 (ref 1.010–1.025)
Urobilinogen, UA: 0.2 E.U./dL
pH, UA: 6 (ref 5.0–8.0)

## 2018-06-30 NOTE — Progress Notes (Signed)
Subjective:    Patient ID: Misty [name], female    DOB: November 12, 1970, 48 y.o.   MRN: [id number]  HPI This is a 48 yo female who presents today for 3 month follow up. She is accompanied by her grown daughter.  Has been unable to eat due to bloating, gas pains, abdomen feels "prickly." Not eating solid food, drinking protein drinks. Bowels moving regularly, some loose stools, takes magnesium to help with bowels, no blood or mucus. No acid indigestion.  No dysuria, no burning. Some pain in left flank area. Had fevers for a couple of days, resolved.  Sleeping better. Not taking Remeron, did not think she needed it.  She watches her calories and is concerned about gaining weight. Eats [phone] calories daily.     Past Medical History:  Diagnosis Date  . Allergic rhinitis   . Anxiety   . Arthralgia of multiple joints   . Asthma   . Cervical spondylosis   . Chronic pain syndrome   . Chronic sinusitis   . Chronic suppurative otitis media of left ear   . DDD (degenerative disc disease), lumbar   . Depression   . Diabetes mellitus without complication (HCC)   . Dyspnea    with exertion  . Elevated LFTs   . Fibromyalgia   . Headache    tension/stress headaches  . Hidradenitis   . Hirsutism   . History of hematuria   . History of UTI   . Hyperlipidemia   . Hypertension   . Increased urinary frequency   . Insulin resistance   . Kidney stones   . Low back pain   . Lumbar spondylosis   . Meniere's disease   . OA (osteoarthritis)   . PCOS (polycystic ovarian syndrome)   . Plantar fasciitis   . PONV (postoperative nausea and vomiting)   . Psoriasis   . PTSD (post-traumatic stress disorder)   . SI (sacroiliac) pain   . Swelling of eyelid    Bilateral, occ to wear she can't open them   Past Surgical History:  Procedure Laterality Date  . AXILLARY HIDRADENITIS EXCISION Bilateral 03/30/2013  . CESAREAN SECTION     x3  . CESAREAN SECTION  [phone]  . CYSTOSCOPY W/ URETERAL  STENT PLACEMENT Left 10/01/2017   Procedure: CYSTOSCOPY WITH RETROGRADE PYELOGRAM/URETERAL STENT PLACEMENT;  Surgeon: Sebastian Ache, MD;  Location: [facility];  Service: Urology;  Laterality: Left;  . CYSTOSCOPY WITH RETROGRADE PYELOGRAM, URETEROSCOPY AND STENT PLACEMENT Bilateral 10/25/2017   Procedure: CYSTOSCOPY WITH RETROGRADE PYELOGRAM, URETEROSCOPY AND STENT PLACEMENT;  Surgeon: Sebastian Ache, MD;  Location: [facility];  Service: Urology;  Laterality: Bilateral;  . DENTAL SURGERY    . FINGER SURGERY    . HOLMIUM LASER APPLICATION Bilateral 10/25/2017   Procedure: HOLMIUM LASER APPLICATION;  Surgeon: Sebastian Ache, MD;  Location: [facility];  Service: Urology;  Laterality: Bilateral;  . KIDNEY STONE SURGERY     3-4 times  . MYRINGOTOMY WITH TUBE PLACEMENT Bilateral   . SHOULDER SURGERY     x2  . TONSILLECTOMY     Family History  Problem Relation Age of Onset  . COPD Mother   . Hypertension Mother   . Arthritis Maternal Grandmother    Social History   Tobacco Use  . Smoking status: Former Games developer  . Smokeless tobacco: Never Used  . Tobacco comment: 2-3 cigarettes per day 3 years; quit 2 years ago  Substance Use Topics  . Alcohol use: Not Currently  Alcohol/week: 0.0 standard drinks    Comment: occasional   . Drug use: No      Review of Systems Per hpi    Objective:   Physical Exam Vitals signs reviewed.  Constitutional:      General: She is not in acute distress.    Appearance: Normal appearance. She is normal weight. She is not ill-appearing, toxic-appearing or diaphoretic.  HENT:     Head: Normocephalic and atraumatic.  Neck:     Musculoskeletal: Normal range of motion and neck supple.  Cardiovascular:     Rate and Rhythm: Normal rate and regular rhythm.     Heart sounds: Normal heart sounds.     Comments: Normal HR on auscultation.  Pulmonary:     Effort: Pulmonary effort is normal.     Breath sounds: Normal breath sounds.    Abdominal:     General: Abdomen is flat. There is no distension.     Palpations: There is no mass.     Tenderness: There is abdominal tenderness (mild generalized). There is no guarding or rebound.     Hernia: No hernia is present.  Musculoskeletal:     Right lower leg: No edema.     Left lower leg: No edema.  Skin:    General: Skin is warm and dry.  Neurological:     Mental Status: She is alert and oriented to person, place, and time.  Psychiatric:        Mood and Affect: Mood normal.        Behavior: Behavior normal.        Thought Content: Thought content normal.        Judgment: Judgment normal.       BP 100/78   Pulse (!) 127   Temp 98.6 F (37 C) (Oral)   Resp 18   Ht 5\' 4"  (1.626 m)   Wt 146 lb 6.4 oz (66.4 kg)   SpO2 99%   BMI 25.13 kg/m  Wt Readings from Last 3 Encounters:  06/30/18 146 lb 6.4 oz (66.4 kg)  03/31/18 148 lb (67.1 kg)  03/21/18 141 lb 1.9 oz (64 kg)   Recheck HR 100    Assessment & Plan:  1. Abdominal bloating - no improvement, will check [place] - US Abdomen Complete; Future - US PELVIC COMPLETE WITH TRANSVAGINAL; Future - POCT urinalysis dipstick  2. Enlarged uterus - has history of enlarged uterus - US PELVIC COMPLETE WITH TRANSVAGINAL; Future  3. Flank pain - check UA  4. Decreased GFR - Basic Metabolic Panel  - follow up in 6 months  Olean Ree, FNP-BC  [facility], [facility]  07/06/2018 8:48 PM

## 2018-06-30 NOTE — Patient Instructions (Signed)
Good to see you today   I have put in orders for an abdominal and pelvic ultrasound. Please see a referrals coordinator to schedule  Stop at the lab  Follow up in 6 months

## 2018-07-03 ENCOUNTER — Other Ambulatory Visit: Payer: BLUE CROSS/BLUE SHIELD

## 2018-07-06 ENCOUNTER — Encounter: Payer: Self-pay | Admitting: Family Medicine

## 2018-07-07 ENCOUNTER — Ambulatory Visit
Admission: RE | Admit: 2018-07-07 | Discharge: 2018-07-07 | Disposition: A | Discharge status: Home / Self Care | Payer: BLUE CROSS/BLUE SHIELD | Source: Ambulatory Visit | Attending: Family Medicine | Admitting: Family Medicine

## 2018-07-07 ENCOUNTER — Telehealth: Payer: Self-pay | Admitting: *Deleted

## 2018-07-07 DIAGNOSIS — D251 Intramural leiomyoma of uterus: Secondary | ICD-10-CM | POA: Diagnosis not present

## 2018-07-07 DIAGNOSIS — N200 Calculus of kidney: Secondary | ICD-10-CM | POA: Diagnosis not present

## 2018-07-07 DIAGNOSIS — R140 Abdominal distension (gaseous): Secondary | ICD-10-CM

## 2018-07-07 DIAGNOSIS — N852 Hypertrophy of uterus: Secondary | ICD-10-CM

## 2018-07-07 DIAGNOSIS — D252 Subserosal leiomyoma of uterus: Secondary | ICD-10-CM | POA: Diagnosis not present

## 2018-07-07 NOTE — Telephone Encounter (Signed)
Marylene Land with [facility] called to let you know that the abdominal report is in Epic. Marylene Land called to let you know that there is a questionable pancreatic mass and it is recommended that patient have a CT scan done.

## 2018-07-07 NOTE — Telephone Encounter (Signed)
Noted. Will contact patient and order CT.

## 2018-07-11 ENCOUNTER — Other Ambulatory Visit: Payer: Self-pay | Admitting: Family Medicine

## 2018-07-11 DIAGNOSIS — R9389 Abnormal findings on diagnostic imaging of other specified body structures: Secondary | ICD-10-CM

## 2018-07-11 NOTE — Addendum Note (Signed)
Addended by: Olean Ree B on: 07/11/2018 08:42 AM   Modules accepted: Orders

## 2018-07-18 NOTE — Telephone Encounter (Signed)
Please advised if patient has been contact and if encounter can be signed and closed?

## 2018-07-18 NOTE — Telephone Encounter (Signed)
It is scheduled for 07/22/18.

## 2018-07-22 ENCOUNTER — Inpatient Hospital Stay: Admission: RE | Admit: 2018-07-22 | Payer: BLUE CROSS/BLUE SHIELD | Source: Ambulatory Visit

## 2018-08-04 ENCOUNTER — Telehealth: Payer: Self-pay | Admitting: Family Medicine

## 2018-08-04 ENCOUNTER — Encounter: Payer: Self-pay | Admitting: Family Medicine

## 2018-08-04 NOTE — Telephone Encounter (Signed)
Called patient regarding her CT scan. No answer, left her a message regarding the following. She cancelled her CT abdomen due to concerns for COVID19, but I feel that it is important for her to go ahead and get the test done as soon as possible for further evaluation of pancreatic mass. I have asked her to call the office to discuss and also sent her a Mychart message.

## 2018-08-06 ENCOUNTER — Telehealth: Payer: Self-pay

## 2018-08-06 NOTE — Telephone Encounter (Signed)
Called pt and left voicemail for patient to call us back to set up [email or web address] appt

## 2018-08-07 NOTE — Telephone Encounter (Signed)
I left msg for pt to return my call, please set up [email or web address] per request below.

## 2018-08-12 DIAGNOSIS — M533 Sacrococcygeal disorders, not elsewhere classified: Secondary | ICD-10-CM | POA: Diagnosis not present

## 2018-08-12 DIAGNOSIS — M170 Bilateral primary osteoarthritis of knee: Secondary | ICD-10-CM | POA: Diagnosis not present

## 2018-08-12 DIAGNOSIS — M5136 Other intervertebral disc degeneration, lumbar region: Secondary | ICD-10-CM | POA: Diagnosis not present

## 2018-08-18 NOTE — Telephone Encounter (Signed)
I left 3rd msg

## 2018-09-08 ENCOUNTER — Other Ambulatory Visit: Payer: Self-pay | Admitting: Family Medicine

## 2018-09-08 DIAGNOSIS — E282 Polycystic ovarian syndrome: Secondary | ICD-10-CM

## 2018-10-28 ENCOUNTER — Telehealth: Payer: Self-pay | Admitting: Family Medicine

## 2018-10-28 NOTE — Telephone Encounter (Signed)
LMOM.  CT Abdomen was reauthorized.  Pt needs to reschedule.

## 2018-10-28 NOTE — Telephone Encounter (Signed)
Noted  

## 2018-11-03 ENCOUNTER — Telehealth: Payer: Self-pay | Admitting: Family Medicine

## 2018-11-03 NOTE — Telephone Encounter (Signed)
Pt's spouse called to set up appt for pt for hot flashes, chest pain and tightness. Pt was not with her spouse to triage so I set up an agreed upon appt 7/8 and asked him to have pt call back and choose option #4 to speak with a triage nurse. He said he would get in touch with her. He was aware if her symptoms get any worse she will need to go to ER, he voiced understanding. I tried to call pt to transfer to triage but there was no answer.

## 2018-11-03 NOTE — Telephone Encounter (Signed)
Noted  

## 2018-11-05 ENCOUNTER — Ambulatory Visit: Payer: BC Managed Care – PPO | Admitting: Family Medicine

## 2018-11-05 ENCOUNTER — Encounter: Payer: Self-pay | Admitting: Family Medicine

## 2018-11-05 ENCOUNTER — Other Ambulatory Visit: Payer: Self-pay

## 2018-11-05 VITALS — BP 138/82 | HR 125 | Temp 98.2°F | Ht 64.0 in | Wt 149.0 lb

## 2018-11-05 DIAGNOSIS — R7989 Other specified abnormal findings of blood chemistry: Secondary | ICD-10-CM | POA: Diagnosis not present

## 2018-11-05 DIAGNOSIS — R21 Rash and other nonspecific skin eruption: Secondary | ICD-10-CM

## 2018-11-05 DIAGNOSIS — M2540 Effusion, unspecified joint: Secondary | ICD-10-CM

## 2018-11-05 DIAGNOSIS — R944 Abnormal results of kidney function studies: Secondary | ICD-10-CM

## 2018-11-05 DIAGNOSIS — N951 Menopausal and female climacteric states: Secondary | ICD-10-CM

## 2018-11-05 DIAGNOSIS — J9801 Acute bronchospasm: Secondary | ICD-10-CM | POA: Diagnosis not present

## 2018-11-05 LAB — BASIC METABOLIC PANEL
BUN: 19 mg/dL (ref 6–23)
CO2: 29 mEq/L (ref 19–32)
Calcium: 9.8 mg/dL (ref 8.4–10.5)
Chloride: 103 mEq/L (ref 96–112)
Creatinine, Ser: 1.22 mg/dL — ABNORMAL HIGH (ref 0.40–1.20)
GFR: 46.78 mL/min — ABNORMAL LOW (ref >60.00)
Glucose, Bld: 77 mg/dL (ref 70–99)
Potassium: 4.9 mEq/L (ref 3.5–5.1)
Sodium: 140 mEq/L (ref 135–145)

## 2018-11-05 LAB — HIGH SENSITIVITY CRP: CRP, High Sensitivity: 0.2 mg/L (ref 0.000–5.000)

## 2018-11-05 MED ORDER — CLOTRIMAZOLE-BETAMETHASONE 1-0.05 % EX CREA: 1.0000 "application " | TOPICAL_CREAM | Freq: Two times a day (BID) | CUTANEOUS | 0 refills | Status: DC

## 2018-11-05 MED ORDER — ALBUTEROL SULFATE HFA 108 (90 BASE) MCG/ACT IN AERS: 2.0000 | INHALATION_SPRAY | RESPIRATORY_TRACT | 0 refills | Status: DC | PRN

## 2018-11-05 NOTE — Progress Notes (Signed)
Subjective:    Patient ID: Misty [name], female    DOB: 07/24/1970, 48 y.o.   MRN: [id number]  HPI This is a 48 yo female who presents today with several concerns.  She has had hot flashes 2-3 weeks, missed period x 2 weeks. Husband has had vasectomy. Hot flashes 10x/ day, lasts several minutes. Taking black cohosh with some improvement.  Increased hand and feet swelling, pain. Some shoulder and low back pain. Taking ibuprofen 800 mg 3x/ day. Family history of RA.  Chest tightness that she attributes to asthma. Some pain with deep breath, feels tight. Occasional cough with deep breath. No suputm. Has had albuterol inhaler in the past.  Lower abdomen with rash. Chronic issues since weight loss. She needs a panniculectomy. Applies hand sanitizer. Also some rash on genitals.  Had us of abdomen/pelvis 07/07/18 which showed enlarged ovaries consistent with PCOS history and mass near pancreatic tail. CT of abdomen was ordered. Patient did not go. She was concerned about consuming contrast and also Covid 19. She was called multiple times, notified via mychart and a letter was sent. Today she agrees to reschedule scan.   Past Medical History:  Diagnosis Date  . Allergic rhinitis   . Anxiety   . Arthralgia of multiple joints   . Asthma   . Cervical spondylosis   . Chronic pain syndrome   . Chronic sinusitis   . Chronic suppurative otitis media of left ear   . DDD (degenerative disc disease), lumbar   . Depression   . Diabetes mellitus without complication (HCC)   . Dyspnea    with exertion  . Elevated LFTs   . Fibromyalgia   . Headache    tension/stress headaches  . Hidradenitis   . Hirsutism   . History of hematuria   . History of UTI   . Hyperlipidemia   . Hypertension   . Increased urinary frequency   . Insulin resistance   . Kidney stones   . Low back pain   . Lumbar spondylosis   . Meniere's disease   . OA (osteoarthritis)   . PCOS (polycystic ovarian syndrome)   . Plantar  fasciitis   . PONV (postoperative nausea and vomiting)   . Psoriasis   . PTSD (post-traumatic stress disorder)   . SI (sacroiliac) pain   . Swelling of eyelid    Bilateral, occ to wear she can't open them   Past Surgical History:  Procedure Laterality Date  . AXILLARY HIDRADENITIS EXCISION Bilateral 03/30/2013  . CESAREAN SECTION     x3  . CESAREAN SECTION  [phone] [id number]  . CYSTOSCOPY W/ URETERAL STENT PLACEMENT Left 10/01/2017   Procedure: CYSTOSCOPY WITH RETROGRADE PYELOGRAM/URETERAL STENT PLACEMENT;  Surgeon: Sebastian AcheManny, Theodore, MD;  Location: [facility];  Service: Urology;  Laterality: Left;  . CYSTOSCOPY WITH RETROGRADE PYELOGRAM, URETEROSCOPY AND STENT PLACEMENT Bilateral 10/25/2017   Procedure: CYSTOSCOPY WITH RETROGRADE PYELOGRAM, URETEROSCOPY AND STENT PLACEMENT;  Surgeon: Sebastian AcheManny, Theodore, MD;  [facility];  Service: Urology;  Laterality: Bilateral;  . DENTAL SURGERY    . FINGER SURGERY    . HOLMIUM LASER APPLICATION Bilateral 10/25/2017   Procedure: HOLMIUM LASER APPLICATION;  Surgeon: Sebastian AcheManny, Theodore, MD;  [facility] [place];  Service: Urology;  Laterality: Bilateral;  . KIDNEY STONE SURGERY     3-4 times  . MYRINGOTOMY WITH TUBE PLACEMENT Bilateral   . SHOULDER SURGERY     x2  . TONSILLECTOMY     Family History  Problem Relation  Age of Onset  . COPD Mother   . Hypertension Mother   . Arthritis Maternal Grandmother    Social History   Tobacco Use  . Smoking status: Former Research scientist (life sciences)  . Smokeless tobacco: Never Used  . Tobacco comment: 2-3 cigarettes per day 3 years; quit 2 years ago  Substance Use Topics  . Alcohol use: Not Currently    Alcohol/week: 0.0 standard drinks    Comment: occasional   . Drug use: No      Review of Systems Per HPI    Objective:   Physical Exam Constitutional:      General: She is not in acute distress.    Appearance: Normal appearance. She is normal weight. She is not ill-appearing,  toxic-appearing or diaphoretic.  HENT:     Head: Normocephalic and atraumatic.  Eyes:     Conjunctiva/sclera: Conjunctivae normal.  Cardiovascular:     Rate and Rhythm: Normal rate and regular rhythm.     Heart sounds: Normal heart sounds.  Pulmonary:     Effort: Pulmonary effort is normal. No respiratory distress.     Breath sounds: Normal breath sounds. No stridor. No wheezing, rhonchi or rales.  Musculoskeletal:     Comments: Bilateral hands with PIP enlargement, some redness left forefinger PIP.   Skin:    General: Skin is warm and dry.     Comments: Lower abdomen below pannus slightly erythematous, visible crack (chronic per patient).  Monds with scattered areas erythema, no scaling.   Neurological:     Mental Status: She is alert and oriented to person, place, and time.  Psychiatric:        Mood and Affect: Mood normal.        Behavior: Behavior normal.        Thought Content: Thought content normal.        Judgment: Judgment normal.       BP 138/82 (BP Location: Left Arm, Patient Position: Sitting, Cuff Size: Normal)   Pulse (!) 125   Temp 98.2 F (36.8 C) (Oral)   Ht 5\' 4"  (1.626 m)   Wt 149 lb (67.6 kg)   SpO2 95%   BMI 25.58 kg/m  Wt Readings from Last 3 Encounters:  11/05/18 149 lb (67.6 kg)  06/30/18 146 lb 6.4 oz (66.4 kg)  03/31/18 148 lb (67.1 kg)   .    Assessment & Plan:  1. Bronchospasm - lungs clear today, discussed occasional use of rescue inhaler and if need for regular use, will need to consider maintenance inhaler - albuterol (VENTOLIN HFA) 108 (90 Base) MCG/ACT inhaler; Inhale 2 puffs into the lungs every 4 (four) hours as needed for wheezing or shortness of breath (cough, shortness of breath or wheezing.).  Dispense: 16 g; Refill: 0  2. Elevated serum creatinine - I advised her to avoid NSAIDs as previously had elevated creatinine - Basic Metabolic Panel  3. Joint swelling - Rheumatoid factor - High sensitivity CRP  4. Rash -  clotrimazole-betamethasone (LOTRISONE) cream; Apply 1 application topically 2 (two) times daily. For up to 10 days.  Dispense: 45 g; Refill: 0  5. Perimenopause - will consider hormone replacement once CT abdomen and lab tests returned.    Clarene Reamer, FNP-BC  [facility], [facility]  11/05/2018 5:28 PM

## 2018-11-05 NOTE — Patient Instructions (Addendum)
Salicylic acid acne wash (acne)  I have sent a steroid/antifungal cream and an inhaler to your pharmacy.   We will decide on hormones for hot flashes and other symptoms once CT and labs back.    Perimenopause  Perimenopause is the normal time of life before and after menstrual periods stop completely (menopause). Perimenopause can begin 2-8 years before menopause, and it usually lasts for 1 year after menopause. During perimenopause, the ovaries may or may not produce an egg. What are the causes? This condition is caused by a natural change in hormone levels that happens as you get older. What increases the risk? This condition is more likely to start at an earlier age if you have certain medical conditions or treatments, including:  A tumor of the pituitary gland in the brain.  A disease that affects the ovaries and hormone production.  Radiation treatment for cancer.  Certain cancer treatments, such as chemotherapy or hormone (anti-estrogen) therapy.  Heavy smoking and excessive alcohol use.  Family history of early menopause. What are the signs or symptoms? Perimenopausal changes affect each woman differently. Symptoms of this condition may include:  Hot flashes.  Night sweats.  Irregular menstrual periods.  Decreased sex drive.  Vaginal dryness.  Headaches.  Mood swings.  Depression.  Memory problems or trouble concentrating.  Irritability.  Tiredness.  Weight gain.  Anxiety.  Trouble getting pregnant. How is this diagnosed? This condition is diagnosed based on your medical history, a physical exam, your age, your menstrual history, and your symptoms. Hormone tests may also be done. How is this treated? In some cases, no treatment is needed. You and your health care provider should make a decision together about whether treatment is necessary. Treatment will be based on your individual condition and preferences. Various treatments are available, such  as:  Menopausal hormone therapy (MHT).  Medicines to treat specific symptoms.  Acupuncture.  Vitamin or herbal supplements. Before starting treatment, make sure to let your health care provider know if you have a personal or family history of:  Heart disease.  Breast cancer.  Blood clots.  Diabetes.  Osteoporosis. Follow these instructions at home: Lifestyle  Do not use any products that contain nicotine or tobacco, such as cigarettes and e-cigarettes. If you need help quitting, ask your health care provider.  Eat a balanced diet that includes fresh fruits and vegetables, whole grains, soybeans, eggs, lean meat, and low-fat dairy.  Get at least 30 minutes of physical activity on 5 or more days each week.  Avoid alcoholic and caffeinated beverages, as well as spicy foods. This may help prevent hot flashes.  Get 7-8 hours of sleep each night.  Dress in layers that can be removed to help you manage hot flashes.  Find ways to manage stress, such as deep breathing, meditation, or journaling. General instructions  Keep track of your menstrual periods, including: ? When they occur. ? How heavy they are and how long they last. ? How much time passes between periods.  Keep track of your symptoms, noting when they start, how often you have them, and how long they last.  Take over-the-counter and prescription medicines only as told by your health care provider.  Take vitamin supplements only as told by your health care provider. These may include calcium, vitamin E, and vitamin D.  Use vaginal lubricants or moisturizers to help with vaginal dryness and improve comfort during sex.  Talk with your health care provider before starting any herbal supplements.  Keep  all follow-up visits as told by your health care provider. This is important. This includes any group therapy or counseling. Contact a health care provider if:  You have heavy vaginal bleeding or pass blood  clots.  Your period lasts more than 2 days longer than normal.  Your periods are recurring sooner than 21 days.  You bleed after having sex. Get help right away if:  You have chest pain, trouble breathing, or trouble talking.  You have severe depression.  You have pain when you urinate.  You have severe headaches.  You have vision problems. Summary  Perimenopause is the time when a woman's body begins to move into menopause. This may happen naturally or as a result of other health problems or medical treatments.  Perimenopause can begin 2-8 years before menopause, and it usually lasts for 1 year after menopause.  Perimenopausal symptoms can be managed through medicines, lifestyle changes, and complementary therapies such as acupuncture. This information is not intended to replace advice given to you by your health care provider. Make sure you discuss any questions you have with your health care provider. Document Released: 05/24/2004 Document Revised: 03/29/2017 Document Reviewed: 05/22/2016 Elsevier Patient Education  2020 [employer].

## 2018-11-06 DIAGNOSIS — M2540 Effusion, unspecified joint: Secondary | ICD-10-CM | POA: Diagnosis not present

## 2018-11-06 NOTE — Addendum Note (Signed)
Addended by: Ellamae Sia on: 11/06/2018 07:23 AM   Modules accepted: Orders

## 2018-11-07 LAB — RHEUMATOID FACTOR: Rhuematoid fact SerPl-aCnc: <14 IU/mL (ref <14)

## 2018-11-10 NOTE — Addendum Note (Signed)
Addended by: Clarene Reamer B on: 11/10/2018 08:10 AM   Modules accepted: Orders

## 2018-11-11 DIAGNOSIS — M5136 Other intervertebral disc degeneration, lumbar region: Secondary | ICD-10-CM | POA: Diagnosis not present

## 2018-11-11 DIAGNOSIS — M533 Sacrococcygeal disorders, not elsewhere classified: Secondary | ICD-10-CM | POA: Diagnosis not present

## 2018-11-11 DIAGNOSIS — M170 Bilateral primary osteoarthritis of knee: Secondary | ICD-10-CM | POA: Diagnosis not present

## 2018-11-14 ENCOUNTER — Ambulatory Visit
Admission: RE | Admit: 2018-11-14 | Discharge: 2018-11-14 | Disposition: A | Discharge status: Home / Self Care | Payer: BC Managed Care – PPO | Source: Ambulatory Visit | Attending: Family Medicine | Admitting: Family Medicine

## 2018-11-14 ENCOUNTER — Other Ambulatory Visit: Payer: Self-pay

## 2018-11-14 ENCOUNTER — Telehealth: Payer: Self-pay

## 2018-11-14 DIAGNOSIS — R9389 Abnormal findings on diagnostic imaging of other specified body structures: Secondary | ICD-10-CM

## 2018-11-14 DIAGNOSIS — N200 Calculus of kidney: Secondary | ICD-10-CM | POA: Diagnosis not present

## 2018-11-14 MED ORDER — IOPAMIDOL (ISOVUE-300) INJECTION 61%
100.0000 mL | Freq: Once | INTRAVENOUS | Status: AC | PRN
  Administered 2018-11-14: 13:00:00 100 mL via INTRAVENOUS

## 2018-11-14 NOTE — Telephone Encounter (Signed)
Left message for patient to call back about Ct results

## 2018-12-02 DIAGNOSIS — M533 Sacrococcygeal disorders, not elsewhere classified: Secondary | ICD-10-CM | POA: Diagnosis not present

## 2018-12-29 ENCOUNTER — Telehealth: Payer: Self-pay

## 2018-12-29 ENCOUNTER — Ambulatory Visit: Payer: BC Managed Care – PPO | Admitting: Family Medicine

## 2018-12-29 NOTE — Telephone Encounter (Signed)
Pt woke up this morning with S/T and fever of 99; pt had appt today at 9:15 with D Carlean Purl FNP and cancelled that in office FU and will cb if needed for appt and will cb for reschedule after fever and S/T is gone.

## 2018-12-29 NOTE — Telephone Encounter (Signed)
Noted  

## 2019-01-07 ENCOUNTER — Other Ambulatory Visit: Payer: Self-pay

## 2019-01-07 ENCOUNTER — Ambulatory Visit (INDEPENDENT_AMBULATORY_CARE_PROVIDER_SITE_OTHER): Payer: BC Managed Care – PPO | Admitting: Family Medicine

## 2019-01-07 VITALS — BP 128/82 | HR 78 | Temp 98.8°F | Wt 145.8 lb

## 2019-01-07 DIAGNOSIS — L987 Excessive and redundant skin and subcutaneous tissue: Secondary | ICD-10-CM

## 2019-01-07 DIAGNOSIS — Z8639 Personal history of other endocrine, nutritional and metabolic disease: Secondary | ICD-10-CM

## 2019-01-07 DIAGNOSIS — F329 Major depressive disorder, single episode, unspecified: Secondary | ICD-10-CM

## 2019-01-07 DIAGNOSIS — G8929 Other chronic pain: Secondary | ICD-10-CM

## 2019-01-07 DIAGNOSIS — M25511 Pain in right shoulder: Secondary | ICD-10-CM

## 2019-01-07 DIAGNOSIS — Z9889 Other specified postprocedural states: Secondary | ICD-10-CM

## 2019-01-07 DIAGNOSIS — R200 Anesthesia of skin: Secondary | ICD-10-CM

## 2019-01-07 DIAGNOSIS — R202 Paresthesia of skin: Secondary | ICD-10-CM

## 2019-01-07 LAB — POCT GLYCOSYLATED HEMOGLOBIN (HGB A1C): Hemoglobin A1C: 5 % (ref 4.0–5.6)

## 2019-01-07 NOTE — Patient Instructions (Addendum)
Let me know if you want nerve conduction tests on your legs  I have put in referral to shoulder specialist, plastic surgeon  Follow up in 4-6 months for your complete physical

## 2019-01-07 NOTE — Progress Notes (Signed)
Subjective:    Patient ID: Misty [name], female    DOB: [birth year], 48 y.o.   MRN: [id number]  HPI This is a 48 yo female who presents today for follow up of chronic medical conditions.   Feet numbness- For several weeks, feet go numb several times a day, lasts for a few minutes. Feet go white. Has had similar issues with hands, no triggered by temperature change.   Depression- worse lately, she feels bad about her body, excess skin around thighs, abdomen and upper arms. Continues to be seen by pain management. On chronic hydrocodone, buprenorphine, back pain stable, more bothered recently with shoulder pain (see below).   Plantar fascitis- when feet get too hot or cold, can't tolerate walking  Hot flashes- significantly improved with estroven.   Right shoulder- worsening, increase pain/numbness, inability to raise arm over head. Had xray prior and referral to ortho (was not able to go at the time). Prior shoulder surgery. Is interested in following up with ortho at this time.    Past Medical History:  Diagnosis Date  . Allergic rhinitis   . Anxiety   . Arthralgia of multiple joints   . Asthma   . Cervical spondylosis   . Chronic pain syndrome   . Chronic sinusitis   . Chronic suppurative otitis media of left ear   . DDD (degenerative disc disease), lumbar   . Depression   . Diabetes mellitus without complication ([place])   . Dyspnea    with exertion  . Elevated LFTs   . Fibromyalgia   . Headache    tension/stress headaches  . Hidradenitis   . Hirsutism   . History of hematuria   . History of UTI   . Hyperlipidemia   . Hypertension   . Increased urinary frequency   . Insulin resistance   . Kidney stones   . Low back pain   . Lumbar spondylosis   . Meniere's disease   . OA (osteoarthritis)   . PCOS (polycystic ovarian syndrome)   . Plantar fasciitis   . PONV (postoperative nausea and vomiting)   . Psoriasis   . PTSD (post-traumatic stress disorder)   . SI  (sacroiliac) pain   . Swelling of eyelid    Bilateral, occ to wear she can't open them   Past Surgical History:  Procedure Laterality Date  . AXILLARY HIDRADENITIS EXCISION Bilateral 03/30/2013  . CESAREAN SECTION     x3  . CESAREAN SECTION  [phone]  . CYSTOSCOPY W/ URETERAL STENT PLACEMENT Left 10/01/2017   Procedure: CYSTOSCOPY WITH RETROGRADE PYELOGRAM/URETERAL STENT PLACEMENT;  Surgeon: Alexis Frock, MD;  Location: [facility];  Service: Urology;  Laterality: Left;  . CYSTOSCOPY WITH RETROGRADE PYELOGRAM, URETEROSCOPY AND STENT PLACEMENT Bilateral 10/25/2017   Procedure: CYSTOSCOPY WITH RETROGRADE PYELOGRAM, URETEROSCOPY AND STENT PLACEMENT;  Surgeon: Alexis Frock, MD;  Location: [facility];  Service: Urology;  Laterality: Bilateral;  . DENTAL SURGERY    . FINGER SURGERY    . HOLMIUM LASER APPLICATION Bilateral 7/86/7672   Procedure: HOLMIUM LASER APPLICATION;  Surgeon: Alexis Frock, MD;  Location: [facility];  Service: Urology;  Laterality: Bilateral;  . KIDNEY STONE SURGERY     3-4 times  . MYRINGOTOMY WITH TUBE PLACEMENT Bilateral   . SHOULDER SURGERY     x2  . TONSILLECTOMY     Family History  Problem Relation Age of Onset  . COPD Mother   . Hypertension Mother   . Arthritis Maternal Grandmother  Social History   Tobacco Use  . Smoking status: Former Games developer  . Smokeless tobacco: Never Used  . Tobacco comment: 2-3 cigarettes per day 3 years; quit 2 years ago  Substance Use Topics  . Alcohol use: Not Currently    Alcohol/week: 0.0 standard drinks    Comment: occasional   . Drug use: No      Review of Systems Per HPI    Objective:   Physical Exam Vitals signs reviewed.  Constitutional:      General: She is not in acute distress.    Appearance: Normal appearance. She is normal weight. She is not ill-appearing, toxic-appearing or diaphoretic.  HENT:     Head: Normocephalic and atraumatic.  Eyes:      Conjunctiva/sclera: Conjunctivae normal.  Neck:     Musculoskeletal: Normal range of motion and neck supple.  Cardiovascular:     Rate and Rhythm: Normal rate.     Pulses: Normal pulses.  Pulmonary:     Effort: Pulmonary effort is normal.  Musculoskeletal:     Right shoulder: She exhibits decreased range of motion, tenderness, bony tenderness and decreased strength.     Right lower leg: No edema.     Left lower leg: No edema.  Skin:    General: Skin is warm and dry.  Neurological:     Mental Status: She is alert and oriented to person, place, and time.  Psychiatric:        Mood and Affect: Mood normal.        Behavior: Behavior normal.        Thought Content: Thought content normal.        Judgment: Judgment normal.       BP 128/82   Pulse 78   Temp 98.8 F (37.1 C)   Wt 145 lb 12 oz (66.1 kg)   BMI 25.02 kg/m  Wt Readings from Last 3 Encounters:  01/07/19 145 lb 12 oz (66.1 kg)  11/05/18 149 lb (67.6 kg)  06/30/18 146 lb 6.4 oz (66.4 kg)   BP Readings from Last 3 Encounters:  01/07/19 128/82  11/05/18 138/82  06/30/18 100/78   Depression screen PHQ 2/9 01/07/2019 12/25/2017  Decreased Interest 3 3  Down, Depressed, Hopeless 3 3  PHQ - 2 Score 6 6  Altered sleeping 3 3  Tired, decreased energy 3 3  Change in appetite 2 3  Feeling bad or failure about yourself  1 3  Trouble concentrating 1 1  Moving slowly or fidgety/restless 0 -  Suicidal thoughts 0 -  PHQ-9 Score 16 19  Difficult doing work/chores Somewhat difficult -       Assessment & Plan:  1. Chronic right shoulder pain - Ambulatory referral to Orthopedic Surgery  2. H/O shoulder surgery - Ambulatory referral to Orthopedic Surgery  3. Excess skin of abdomen - this has been very distressing to patient and affects her ability to exercise, interact with others - Ambulatory referral to Plastic Surgery  4. History of diabetes mellitus - controlled without meds since weight loss, taking metformin for  PCOS - HgB A1c  5. Numbness and tingling of both feet - seems intermittent in nature, discussed obtaining EMG studies/ neuro referral, she declines additional work up at this time  6. Reactive depression - she reports that her depression has been worse due to shoulder pain and body image, discussed prioritizing her concerns and she was satisfied to start with ortho referral and plastic surgery referral.  - has declined medication  in past due to potential for weigh gain  - follow up in 3-4 months for CPE   Olean Reeeborah Cordell Guercio, FNP-BC  [facility]  01/09/2019 9:02 AM

## 2019-01-09 ENCOUNTER — Encounter: Payer: Self-pay | Admitting: Family Medicine

## 2019-01-19 DIAGNOSIS — N183 Chronic kidney disease, stage 3 (moderate): Secondary | ICD-10-CM | POA: Diagnosis not present

## 2019-01-19 DIAGNOSIS — I10 Essential (primary) hypertension: Secondary | ICD-10-CM | POA: Diagnosis not present

## 2019-01-19 DIAGNOSIS — R8281 Pyuria: Secondary | ICD-10-CM | POA: Diagnosis not present

## 2019-01-19 DIAGNOSIS — N200 Calculus of kidney: Secondary | ICD-10-CM | POA: Diagnosis not present

## 2019-01-27 ENCOUNTER — Other Ambulatory Visit: Payer: Self-pay

## 2019-01-27 ENCOUNTER — Ambulatory Visit (INDEPENDENT_AMBULATORY_CARE_PROVIDER_SITE_OTHER): Payer: BC Managed Care – PPO | Admitting: Internal Medicine

## 2019-01-27 ENCOUNTER — Telehealth: Payer: Self-pay | Admitting: *Deleted

## 2019-01-27 ENCOUNTER — Encounter: Payer: Self-pay | Admitting: Internal Medicine

## 2019-01-27 VITALS — BP 132/80 | HR 104 | Temp 98.2°F | Wt 141.0 lb

## 2019-01-27 DIAGNOSIS — M7541 Impingement syndrome of right shoulder: Secondary | ICD-10-CM | POA: Diagnosis not present

## 2019-01-27 DIAGNOSIS — H01024 Squamous blepharitis left upper eyelid: Secondary | ICD-10-CM | POA: Diagnosis not present

## 2019-01-27 DIAGNOSIS — M25511 Pain in right shoulder: Secondary | ICD-10-CM | POA: Diagnosis not present

## 2019-01-27 MED ORDER — ERYTHROMYCIN 5 MG/GM OP OINT: 1.0000 "application " | TOPICAL_OINTMENT | Freq: Every day | OPHTHALMIC | 0 refills | Status: DC

## 2019-01-27 NOTE — Telephone Encounter (Signed)
Will discuss at upcoming appt.

## 2019-01-27 NOTE — Telephone Encounter (Signed)
Dr. Netta Cedars at [facility] left a voicemail stating that he just saw the patient for her shoulder and it appears that she has a periorbital infection. Dr. Veverly Fells stated that he told the patient to head over to the office to be seen.

## 2019-01-27 NOTE — Progress Notes (Signed)
Subjective:    Patient ID: Misty [name], female    DOB: 02/13/1971, 48 y.o.   MRN: [id number]  HPI  Pt reports eye swelling and redness. She noticed this 5 days ago. She reports her eye is sore and irritated. She describes burning, itching without drainage. She denies runny nose, nasal congestion, ear pain, sore throat or cough. She denies fever, chills or body aches. She has not recently changed her eye makeup or facial wash. She denies eye trauma. She has tried warm compresses, Zyrtec, Visine eye drops with minimal relief.  Review of Systems  Past Medical History:  Diagnosis Date  . Allergic rhinitis   . Anxiety   . Arthralgia of multiple joints   . Asthma   . Cervical spondylosis   . Chronic pain syndrome   . Chronic sinusitis   . Chronic suppurative otitis media of left ear   . DDD (degenerative disc disease), lumbar   . Depression   . Diabetes mellitus without complication (HCC)   . Dyspnea    with exertion  . Elevated LFTs   . Fibromyalgia   . Headache    tension/stress headaches  . Hidradenitis   . Hirsutism   . History of hematuria   . History of UTI   . Hyperlipidemia   . Hypertension   . Increased urinary frequency   . Insulin resistance   . Kidney stones   . Low back pain   . Lumbar spondylosis   . Meniere's disease   . OA (osteoarthritis)   . PCOS (polycystic ovarian syndrome)   . Plantar fasciitis   . PONV (postoperative nausea and vomiting)   . Psoriasis   . PTSD (post-traumatic stress disorder)   . SI (sacroiliac) pain   . Swelling of eyelid    Bilateral, occ to wear she can't open them    Current Outpatient Medications  Medication Sig Dispense Refill  . albuterol (VENTOLIN HFA) 108 (90 Base) MCG/ACT inhaler Inhale 2 puffs into the lungs every 4 (four) hours as needed for wheezing or shortness of breath (cough, shortness of breath or wheezing.). 16 g 0  . aspirin EC 81 MG tablet Take 81 mg by mouth daily.    . Buprenorphine HCl (BELBUCA) 150  MCG FILM Place inside cheek. Place 1 film inside cheek every 12 hrs    . clotrimazole-betamethasone (LOTRISONE) cream Apply 1 application topically 2 (two) times daily. For up to 10 days. 45 g 0  . Collagen-Boron-Hyaluronic Acid 10-5-3.3 MG TABS     . Evening Primrose Oil 1000 MG CAPS     . HYDROcodone-acetaminophen (NORCO) 10-325 MG tablet TK 1 T PO BID PRF PAIN  0  . ibuprofen (ADVIL) 200 MG tablet Take 1,200 mg by mouth daily.    . magnesium oxide (MAG-OX) 400 MG tablet Take 400 mg by mouth daily.    . metFORMIN (GLUCOPHAGE) 850 MG tablet TAKE 1 TABLET(850 MG) BY MOUTH TWICE DAILY WITH A MEAL 180 tablet 2  . Multiple Vitamins-Minerals (HAIR SKIN NAILS PO)     . niacinamide 100 MG tablet     . vitamin B-12 (CYANOCOBALAMIN) 1000 MCG tablet Take 1,000 mcg by mouth daily.    . vitamin C (ASCORBIC ACID) 500 MG tablet Take 500 mg by mouth daily.    . Zinc 100 MG TABS      No current facility-administered medications for this visit.     Allergies  Allergen Reactions  . Niacin And Related Shortness Of Breath and Nausea  And Vomiting  . Lyrica [Pregabalin] Itching and Swelling    Facial, throat swelling  . Metronidazole Nausea And Vomiting    Felt weird   . Propoxyphene Other (See Comments)    Severe headache Other reaction(s): Other (See Comments) headache  . Adhesive [Tape] Itching and Rash    Paper tape: Rash and itching if left on too long    Family History  Problem Relation Age of Onset  . COPD Mother   . Hypertension Mother   . Arthritis Maternal Grandmother     Social History   Socioeconomic History  . Marital status: Married    Spouse name: Not on file  . Number of children: Not on file  . Years of education: Not on file  . Highest education level: Not on file  Occupational History  . Not on file  Social Needs  . Financial resource strain: Not on file  . Food insecurity    Worry: Not on file    Inability: Not on file  . Transportation needs    Medical: Not on  file    Non-medical: Not on file  Tobacco Use  . Smoking status: Former Research scientist (life sciences)  . Smokeless tobacco: Never Used  . Tobacco comment: 2-3 cigarettes per day 3 years; quit 2 years ago  Substance and Sexual Activity  . Alcohol use: Not Currently    Alcohol/week: 0.0 standard drinks    Comment: occasional   . Drug use: No  . Sexual activity: Not on file    Comment: Vasectomy  Lifestyle  . Physical activity    Days per week: Not on file    Minutes per session: Not on file  . Stress: Not on file  Relationships  . Social Herbalist on phone: Not on file    Gets together: Not on file    Attends religious service: Not on file    Active member of club or organization: Not on file    Attends meetings of clubs or organizations: Not on file    Relationship status: Not on file  . Intimate partner violence    Fear of current or ex partner: Not on file    Emotionally abused: Not on file    Physically abused: Not on file    Forced sexual activity: Not on file  Other Topics Concern  . Not on file  Social History Narrative  . Not on file     Constitutional: Denies fever, malaise, fatigue, headache or abrupt weight changes.  HEENT: Pt reports eye swelling and redness. Denies eye pain, ear pain, ringing in the ears, wax buildup, runny nose, nasal congestion, bloody nose, or sore throat. Respiratory: Denies difficulty breathing, shortness of breath, cough or sputum production.   Cardiovascular: Denies chest pain, chest tightness, palpitations or swelling in the hands or feet.   No other specific complaints in a complete review of systems (except as listed in HPI above).     Objective:   Physical Exam  BP 132/80   Pulse (!) 104   Temp 98.2 F (36.8 C) (Temporal)   Wt 141 lb (64 kg)   SpO2 98%   BMI 24.20 kg/m  Wt Readings from Last 3 Encounters:  01/27/19 141 lb (64 kg)  01/07/19 145 lb 12 oz (66.1 kg)  11/05/18 149 lb (67.6 kg)    General: Appears her stated age,  well developed, well nourished in NAD. Skin: Redness and swelling of left upper eyelid. No lesions noted. HEENT: Head:  normal shape and size; Eyes: sclera white, no icterus, conjunctiva pink, PERRLA and EOMs intact;  Pulmonary/Chest: Normal effort and positive vesicular breath sounds. No respiratory distress. No wheezes, rales or ronchi noted.  Neurological: Alert and oriented.   BMET    Component Value Date/Time   NA 140 11/05/2018 1204   K 4.9 11/05/2018 1204   CL 103 11/05/2018 1204   CO2 29 11/05/2018 1204   GLUCOSE 77 11/05/2018 1204   BUN 19 11/05/2018 1204   CREATININE 1.22 (H) 11/05/2018 1204   CREATININE 1.30 (H) 03/21/2018 1553   CALCIUM 9.8 11/05/2018 1204   GFRNONAA >60 10/02/2017 0319   GFRAA >60 10/02/2017 0319    Lipid Panel     Component Value Date/Time   CHOL 203 (H) 12/25/2017 1221   TRIG 194.0 (H) 12/25/2017 1221   HDL 49.80 12/25/2017 1221   CHOLHDL 4 12/25/2017 1221   VLDL 38.8 12/25/2017 1221   LDLCALC 114 (H) 12/25/2017 1221    CBC    Component Value Date/Time   WBC 6.0 12/25/2017 1221   RBC 4.51 12/25/2017 1221   HGB 13.3 12/25/2017 1221   HCT 39.1 12/25/2017 1221   PLT 301.0 12/25/2017 1221   MCV 86.7 12/25/2017 1221   MCH 29.8 10/02/2017 0319   MCHC 34.0 12/25/2017 1221   RDW 14.0 12/25/2017 1221   LYMPHSABS 1.3 12/25/2017 1221   MONOABS 0.4 12/25/2017 1221   EOSABS 0.1 12/25/2017 1221   BASOSABS 0.1 12/25/2017 1221    Hgb A1C Lab Results  Component Value Date   HGBA1C 5.0 01/07/2019            Assessment & Plan:   Blepharitis, Left Eye:  Continue warm compresses Can was lid with baby shampoo RX for Erythromycin eye Ointment QHS x 7 days Continue Zyrtec OTC  Return precautions discussed Nicki Reaper, NP

## 2019-01-27 NOTE — Patient Instructions (Signed)
Blepharitis Blepharitis is swelling of the eyelids. Symptoms may include:  Reddish, scaly skin around the scalp and eyebrows.  Burning or itching of the eyelids.  Fluid coming from the eye at night. This causes the eyelashes to stick together in the morning.  Eyelashes that fall out.  Being sensitive to light. Follow these instructions at home: Pay attention to any changes in how you look or feel. Tell your health care provider about any changes. Follow these instructions to help with your condition: Keeping clean   Wash your hands often.  Wash your eyelids with warm water, or wash them with warm water that is mixed with little bit of baby shampoo. Do this 2 or more times per day.  Wash your face and eyebrows at least once a day.  Use a clean towel each time you dry your eyelids. Do not use the towel to clean or dry other areas of your body. Do not share your towel with anyone. General instructions  Avoid wearing makeup until you get better. Do not share makeup with anyone.  Avoid rubbing your eyes.  Put a warm compress on your eyes 2 times per day for 10 minutes at a time, or as told by your doctor.  If you were given antibiotics in the form of creams or eye drops, use the medicine as told by your doctor. Do not stop using the medicine even if you feel better.  Keep all follow-up visits as told by your doctor. This is important. Contact a doctor if:  Your eyelids feel hot.  You have blisters on your eyelids.  You have a rash on your eyelids.  The swelling does not go away in 2-4 days.  The swelling gets worse. Get help right away if:  You have pain that gets worse.  You have pain that spreads to other parts of your face.  You have redness that gets worse.  You have redness that spreads to other parts of your face.  Your vision changes.  You have pain when you look at lights or things that move.  You have a fever. Summary  Blepharitis is swelling of the  eyelids.  Pay attention to any changes in how your eyes look or feel. Tell your doctor about any changes.  Follow home care instructions as told by your doctor. Wash your hands often. Avoid wearing makeup. Do not rub your eyes.  Use warm compresses, creams, or eye drops as told by your doctor.  Let your doctor know if you have changes in vision, blisters or rash on eyelids, pain that spreads to your face, or warmth on your eyelids. This information is not intended to replace advice given to you by your health care provider. Make sure you discuss any questions you have with your health care provider. Document Released: 01/24/2008 Document Revised: 10/14/2017 Document Reviewed: 10/14/2017 Elsevier Patient Education  2020 Elsevier Inc.  

## 2019-02-02 ENCOUNTER — Other Ambulatory Visit: Payer: Self-pay

## 2019-02-02 ENCOUNTER — Ambulatory Visit: Payer: BC Managed Care – PPO | Admitting: Plastic Surgery

## 2019-02-02 ENCOUNTER — Encounter: Payer: Self-pay | Admitting: Plastic Surgery

## 2019-02-02 DIAGNOSIS — E65 Localized adiposity: Secondary | ICD-10-CM

## 2019-02-02 HISTORY — DX: Localized adiposity: E65

## 2019-02-02 NOTE — Assessment & Plan Note (Addendum)
She has a symptomatic abdominal pannus.  She is a good candidate for panniculectomy.  I will try to keep scar horizontal but she wishes for me to add the vertical component if it will significantly help her contour.  We discussed the procedure would be outpatient.  We discussed risks including bleeding, infection, thigh numbness, need for additional procedures.  We discussed the use of drains and expected postoperative recovery.  We discussed seromas and wound healing complications.  She understands and wants to proceed.  We did discuss the excess skin in the arms and legs.  I explained these procedures would likely be considered cosmetic and she wants to wait and discuss at a later time.

## 2019-02-02 NOTE — Progress Notes (Signed)
New Patient Office Visit  Subjective:  Patient ID: Misty [name], female    DOB: 09/12/1970  Age: 48 y.o. MRN: [id number]  CC:  Chief Complaint  Patient presents with  . Advice Only    for excess skin    HPI Misty [name] presents for panniculectomy discussion.  She has lost 150 pounds with diet and exercise.  Her weight has been stable for 6 months.  She has pain, skin irritation, and trouble exercising.  She has had 3 c sections.  She has tried creams for skin irritation in fold w limited success.    She also complains of excess skin in arms, breasts, and inner thighs.  These areas become bothersome bc skin gets in the way of activities and becomes red and irritated.  Past Medical History:  Diagnosis Date  . Abdominal pannus 02/02/2019  . Allergic rhinitis   . Anxiety   . Arthralgia of multiple joints   . Asthma   . Cervical spondylosis   . Chronic pain syndrome   . Chronic sinusitis   . Chronic suppurative otitis media of left ear   . DDD (degenerative disc disease), lumbar   . Depression   . Diabetes mellitus without complication (HCC)   . Dyspnea    with exertion  . Elevated LFTs   . Fibromyalgia   . Headache    tension/stress headaches  . Hidradenitis   . Hirsutism   . History of hematuria   . History of UTI   . Hyperlipidemia   . Hypertension   . Increased urinary frequency   . Insulin resistance   . Kidney stones   . Low back pain   . Lumbar spondylosis   . Meniere's disease   . OA (osteoarthritis)   . PCOS (polycystic ovarian syndrome)   . Plantar fasciitis   . PONV (postoperative nausea and vomiting)   . Psoriasis   . PTSD (post-traumatic stress disorder)   . SI (sacroiliac) pain   . Swelling of eyelid    Bilateral, occ to wear she can't open them    Past Surgical History:  Procedure Laterality Date  . AXILLARY HIDRADENITIS EXCISION Bilateral 03/30/2013  . CESAREAN SECTION     x3  . CESAREAN SECTION  [phone]  . CYSTOSCOPY W/ URETERAL  STENT PLACEMENT Left 10/01/2017   Procedure: CYSTOSCOPY WITH RETROGRADE PYELOGRAM/URETERAL STENT PLACEMENT;  Surgeon: Sebastian AcheManny, Theodore, MD;  Location: [facility];  Service: Urology;  Laterality: Left;  . CYSTOSCOPY WITH RETROGRADE PYELOGRAM, URETEROSCOPY AND STENT PLACEMENT Bilateral 10/25/2017   Procedure: CYSTOSCOPY WITH RETROGRADE PYELOGRAM, URETEROSCOPY AND STENT PLACEMENT;  Surgeon: Sebastian AcheManny, Theodore, MD;  [facility] [place];  Service: Urology;  Laterality: Bilateral;  . DENTAL SURGERY    . FINGER SURGERY    . HOLMIUM LASER APPLICATION Bilateral 10/25/2017   Procedure: HOLMIUM LASER APPLICATION;  Surgeon: Sebastian AcheManny, Theodore, MD;  [facility];  Service: Urology;  Laterality: Bilateral;  . KIDNEY STONE SURGERY     3-4 times  . MYRINGOTOMY WITH TUBE PLACEMENT Bilateral   . SHOULDER SURGERY     x2  . TONSILLECTOMY      Family History  Problem Relation Age of Onset  . COPD Mother   . Hypertension Mother   . Arthritis Maternal Grandmother     Social History   Socioeconomic History  . Marital status: Married    Spouse name: Not on file  . Number of children: Not on file  . Years of education: Not on  file  . Highest education level: Not on file  Occupational History  . Not on file  Social Needs  . Financial resource strain: Not on file  . Food insecurity    Worry: Not on file    Inability: Not on file  . Transportation needs    Medical: Not on file    Non-medical: Not on file  Tobacco Use  . Smoking status: Former Games developer  . Smokeless tobacco: Never Used  . Tobacco comment: 2-3 cigarettes per day 3 years; quit 2 years ago  Substance and Sexual Activity  . Alcohol use: Not Currently    Alcohol/week: 0.0 standard drinks    Comment: occasional   . Drug use: No  . Sexual activity: Not on file    Comment: Vasectomy  Lifestyle  . Physical activity    Days per week: Not on file    Minutes per session: Not on file  . Stress: Not on file    Relationships  . Social Musician on phone: Not on file    Gets together: Not on file    Attends religious service: Not on file    Active member of club or organization: Not on file    Attends meetings of clubs or organizations: Not on file    Relationship status: Not on file  . Intimate partner violence    Fear of current or ex partner: Not on file    Emotionally abused: Not on file    Physically abused: Not on file    Forced sexual activity: Not on file  Other Topics Concern  . Not on file  Social History Narrative  . Not on file    ROS Review of Systems  Constitutional: Negative for activity change, appetite change, fatigue and fever.  Cardiovascular: Negative for chest pain, palpitations and leg swelling.    Objective:   Today's Vitals: BP (!) 159/70 (BP Location: Left Arm, Patient Position: Sitting, Cuff Size: Normal)   Pulse (!) 104   Temp (!) 97.3 F (36.3 C) (Temporal)   Ht 5\' 4"  (1.626 m)   Wt 143 lb 12.8 oz (65.2 kg)   LMP 12/13/2018 (Exact Date)   SpO2 99%   BMI 24.68 kg/m   Physical Exam Constitutional:      General: She is not in acute distress.    Appearance: Normal appearance. She is normal weight. She is not ill-appearing, toxic-appearing or diaphoretic.  Abdominal:     Comments: Abdomen soft, NT, no hernias palpable.  Large c section scar but no obvious upper abdominal scars.  Irritation present in fold centrally.  Large overhanging pannus.  Musculoskeletal:     Comments: Has skin excess in bilateral arms, thighs.  Primarily skin excess without much fatty tissue left.    Neurological:     Mental Status: She is alert.     Assessment & Plan:   Problem List Items Addressed This Visit      Other   Abdominal pannus    She has a symptomatic abdominal pannus.  She is a good candidate for panniculectomy.  I will try to keep scar horizontal but she wishes for me to add the vertical component if it will significantly help her contour.  We  discussed the procedure would be outpatient.  We discussed risks including bleeding, infection, thigh numbness, need for additional procedures.  We discussed the use of drains and expected postoperative recovery.  We discussed seromas and wound healing complications.  She understands and  wants to proceed.  We did discuss the excess skin in the arms and legs.  I explained these procedures would likely be considered cosmetic and she wants to wait and discuss at a later time.         Outpatient Encounter Medications as of 02/02/2019  Medication Sig  . albuterol (VENTOLIN HFA) 108 (90 Base) MCG/ACT inhaler Inhale 2 puffs into the lungs every 4 (four) hours as needed for wheezing or shortness of breath (cough, shortness of breath or wheezing.).  Marland Kitchen aspirin EC 81 MG tablet Take 81 mg by mouth daily.  Marland Kitchen b complex vitamins capsule Take 1 capsule by mouth daily.  Marland Kitchen BELBUCA 450 MCG FILM PLACE 1 FILM INSIDE THE CHEEK Q 12 HOURS  . Buprenorphine HCl (BELBUCA) 150 MCG FILM Place inside cheek. Place 1 film inside cheek every 12 hrs  . Collagen-Boron-Hyaluronic Acid 10-5-3.3 MG TABS   . HYDROcodone-acetaminophen (NORCO) 10-325 MG tablet TK 1 T PO BID PRF PAIN  . ibuprofen (ADVIL) 200 MG tablet Take 1,200 mg by mouth daily.  . magnesium oxide (MAG-OX) 400 MG tablet Take 400 mg by mouth daily.  . metFORMIN (GLUCOPHAGE) 850 MG tablet TAKE 1 TABLET(850 MG) BY MOUTH TWICE DAILY WITH A MEAL  . Multiple Vitamins-Minerals (HAIR SKIN NAILS PO)   . niacinamide 100 MG tablet   . vitamin B-12 (CYANOCOBALAMIN) 1000 MCG tablet Take 1,000 mcg by mouth daily.  . vitamin C (ASCORBIC ACID) 500 MG tablet Take 500 mg by mouth daily.  . [DISCONTINUED] clotrimazole-betamethasone (LOTRISONE) cream Apply 1 application topically 2 (two) times daily. For up to 10 days.  . [DISCONTINUED] erythromycin ophthalmic ointment Place 1 application into the left eye at bedtime for 7 days.  . [DISCONTINUED] Evening Primrose Oil 1000 MG CAPS     . [DISCONTINUED] Zinc 100 MG TABS    No facility-administered encounter medications on file as of 02/02/2019.     Follow-up: No follow-ups on file.   Cindra Presume, MD

## 2019-02-10 DIAGNOSIS — M5136 Other intervertebral disc degeneration, lumbar region: Secondary | ICD-10-CM | POA: Diagnosis not present

## 2019-02-10 DIAGNOSIS — M170 Bilateral primary osteoarthritis of knee: Secondary | ICD-10-CM | POA: Diagnosis not present

## 2019-02-10 DIAGNOSIS — M533 Sacrococcygeal disorders, not elsewhere classified: Secondary | ICD-10-CM | POA: Diagnosis not present

## 2019-02-12 DIAGNOSIS — M5136 Other intervertebral disc degeneration, lumbar region: Secondary | ICD-10-CM | POA: Diagnosis not present

## 2019-02-25 ENCOUNTER — Encounter: Payer: BC Managed Care – PPO | Admitting: Plastic Surgery

## 2019-02-26 ENCOUNTER — Encounter: Payer: Self-pay | Admitting: Plastic Surgery

## 2019-02-27 ENCOUNTER — Other Ambulatory Visit (HOSPITAL_COMMUNITY): Payer: BC Managed Care – PPO

## 2019-03-02 ENCOUNTER — Other Ambulatory Visit: Payer: Self-pay

## 2019-03-02 ENCOUNTER — Ambulatory Visit (INDEPENDENT_AMBULATORY_CARE_PROVIDER_SITE_OTHER): Payer: BC Managed Care – PPO | Admitting: Family Medicine

## 2019-03-02 ENCOUNTER — Encounter: Payer: Self-pay | Admitting: Family Medicine

## 2019-03-02 VITALS — BP 118/84 | HR 107 | Temp 98.1°F | Resp 16 | Ht 64.0 in | Wt 141.8 lb

## 2019-03-02 DIAGNOSIS — Z9181 History of falling: Secondary | ICD-10-CM

## 2019-03-02 DIAGNOSIS — L732 Hidradenitis suppurativa: Secondary | ICD-10-CM

## 2019-03-02 DIAGNOSIS — R42 Dizziness and giddiness: Secondary | ICD-10-CM

## 2019-03-02 DIAGNOSIS — H01004 Unspecified blepharitis left upper eyelid: Secondary | ICD-10-CM | POA: Diagnosis not present

## 2019-03-02 DIAGNOSIS — L409 Psoriasis, unspecified: Secondary | ICD-10-CM | POA: Diagnosis not present

## 2019-03-02 DIAGNOSIS — F40298 Other specified phobia: Secondary | ICD-10-CM

## 2019-03-02 LAB — CBC WITH DIFFERENTIAL/PLATELET
Basophils Absolute: 0.1 10*3/uL (ref 0.0–0.1)
Basophils Relative: 1.8 % (ref 0.0–3.0)
Eosinophils Absolute: 0 10*3/uL (ref 0.0–0.7)
Eosinophils Relative: 0.9 % (ref 0.0–5.0)
HCT: 38.3 % (ref 36.0–46.0)
Hemoglobin: 13.2 g/dL (ref 12.0–15.0)
Lymphocytes Relative: 32.9 % (ref 12.0–46.0)
Lymphs Abs: 1.5 10*3/uL (ref 0.7–4.0)
MCHC: 34.5 g/dL (ref 30.0–36.0)
MCV: 88.3 fl (ref 78.0–100.0)
Monocytes Absolute: 0.5 10*3/uL (ref 0.1–1.0)
Monocytes Relative: 10.6 % (ref 3.0–12.0)
Neutro Abs: 2.5 10*3/uL (ref 1.4–7.7)
Neutrophils Relative %: 53.8 % (ref 43.0–77.0)
Platelets: 313 10*3/uL (ref 150.0–400.0)
RBC: 4.34 Mil/uL (ref 3.87–5.11)
RDW: 12.8 % (ref 11.5–15.5)
WBC: 4.7 10*3/uL (ref 4.0–10.5)

## 2019-03-02 LAB — COMPREHENSIVE METABOLIC PANEL
ALT: 13 U/L (ref 0–35)
AST: 14 U/L (ref 0–37)
Albumin: 4.6 g/dL (ref 3.5–5.2)
Alkaline Phosphatase: 49 U/L (ref 39–117)
BUN: 9 mg/dL (ref 6–23)
CO2: 33 mEq/L — ABNORMAL HIGH (ref 19–32)
Calcium: 9.7 mg/dL (ref 8.4–10.5)
Chloride: 101 mEq/L (ref 96–112)
Creatinine, Ser: 1.11 mg/dL (ref 0.40–1.20)
GFR: 52.1 mL/min — ABNORMAL LOW (ref >60.00)
Glucose, Bld: 76 mg/dL (ref 70–99)
Potassium: 4 mEq/L (ref 3.5–5.1)
Sodium: 139 mEq/L (ref 135–145)
Total Bilirubin: 0.4 mg/dL (ref 0.2–1.2)
Total Protein: 7 g/dL (ref 6.0–8.3)

## 2019-03-02 NOTE — Progress Notes (Signed)
Subjective:    Patient ID: Misty [name], female    DOB: [birth year], 48 y.o.   MRN: [id number]  HPI This is a 48 yo female who presents today with several concerns.   Blepharitis- was seen 01/27/19, prescribed erythromycin ointment, warm compresses, she reports no improvement and that she attempted to lance it herself.   Larey Seat 02/26/19- has had some numbness of right foot. Was coming up the steps and her right foot caught, causing her to fall. No numbness of toes, mostly of top of right foot, decreased ROM, has improved over last several days.   Right shoulder pain- had injection at end of last month. Some improvement until she fell and caught herself with her right arm, is slowly getting better.   Had panniculectomy scheduled- has needed to postpone secondary to insurance concerns, feels like she has recurrent infections. She remarks that the office staff laughed at her and she doesn't want to go back.   She is very concerned about "getting fat again," and eats one meal a day. Tries to eat protein daily, usually chicken and occasionally a protein bar. Has cut back on soda to one a day. History of diabetes prior to weight loss. Last hgba1c 5.0.   History of hidradenitis, which is worse lately, has been on Humira in past. No recent dermatology. Also history of psoriasis in scalp, worse with stress.   Reports increased stress lately, husband has changed jobs, they need a new roof, she is having to change insurance.   Past Medical History:  Diagnosis Date  . Abdominal pannus 02/02/2019  . Allergic rhinitis   . Anxiety   . Arthralgia of multiple joints   . Asthma   . Cervical spondylosis   . Chronic pain syndrome   . Chronic sinusitis   . Chronic suppurative otitis media of left ear   . DDD (degenerative disc disease), lumbar   . Depression   . Diabetes mellitus without complication (HCC)   . Dyspnea    with exertion  . Elevated LFTs   . Fibromyalgia   . Headache    tension/stress  headaches  . Hidradenitis   . Hirsutism   . History of hematuria   . History of UTI   . Hyperlipidemia   . Hypertension   . Increased urinary frequency   . Insulin resistance   . Kidney stones   . Low back pain   . Lumbar spondylosis   . Meniere's disease   . OA (osteoarthritis)   . PCOS (polycystic ovarian syndrome)   . Plantar fasciitis   . PONV (postoperative nausea and vomiting)   . Psoriasis   . PTSD (post-traumatic stress disorder)   . SI (sacroiliac) pain   . Swelling of eyelid    Bilateral, occ to wear she can't open them   Past Surgical History:  Procedure Laterality Date  . AXILLARY HIDRADENITIS EXCISION Bilateral 03/30/2013  . CESAREAN SECTION     x3  . CESAREAN SECTION  [phone]  . CYSTOSCOPY W/ URETERAL STENT PLACEMENT Left 10/01/2017   Procedure: CYSTOSCOPY WITH RETROGRADE PYELOGRAM/URETERAL STENT PLACEMENT;  Surgeon: Sebastian Ache, MD;  Location: [facility];  Service: Urology;  Laterality: Left;  . CYSTOSCOPY WITH RETROGRADE PYELOGRAM, URETEROSCOPY AND STENT PLACEMENT Bilateral 10/25/2017   Procedure: CYSTOSCOPY WITH RETROGRADE PYELOGRAM, URETEROSCOPY AND STENT PLACEMENT;  Surgeon: Sebastian Ache, MD;  Location: [facility];  Service: Urology;  Laterality: Bilateral;  . DENTAL SURGERY    . FINGER SURGERY    .  HOLMIUM LASER APPLICATION Bilateral 10/25/2017   Procedure: HOLMIUM LASER APPLICATION;  Surgeon: Sebastian AcheManny, Theodore, MD;  [facility];  Service: Urology;  Laterality: Bilateral;  . KIDNEY STONE SURGERY     3-4 times  . MYRINGOTOMY WITH TUBE PLACEMENT Bilateral   . SHOULDER SURGERY     x2  . TONSILLECTOMY     Family History  Problem Relation Age of Onset  . COPD Mother   . Hypertension Mother   . Arthritis Maternal Grandmother    Social History   Tobacco Use  . Smoking status: Former Games developermoker  . Smokeless tobacco: Never Used  . Tobacco comment: 2-3 cigarettes per day 3 years; quit 2 years ago  Substance  Use Topics  . Alcohol use: Not Currently    Alcohol/week: 0.0 standard drinks    Comment: occasional   . Drug use: No      Review of Systems Per HPI    Objective:   Physical Exam Vitals signs reviewed.  Constitutional:      General: She is not in acute distress.    Appearance: She is normal weight. She is not ill-appearing, toxic-appearing or diaphoretic.  HENT:     Head: Normocephalic and atraumatic.  Eyes:     General:        Left eye: Hordeolum (left upper lid) present.    Extraocular Movements: Extraocular movements intact.  Cardiovascular:     Rate and Rhythm: Tachycardia present.  Pulmonary:     Effort: Pulmonary effort is normal.  Musculoskeletal:        General: No swelling or tenderness.     Comments: Normal gait. Right ankle with some decreased ROM, flexion. Normal passive ROM. Some mottling with feet hanging, while on exam table. Normal pulses right PT/DP.   Skin:    General: Skin is dry.     Comments: Skin is cool. She is bare-legged. It is in the 40s outside. Multiple areas hyperpigmentation on bilateral legs. Right knee with approximately 1.5 cm round wound with mild surrounding erythema, appears to be healing. No drainage.   Neurological:     Mental Status: She is alert and oriented to person, place, and time.  Psychiatric:        Behavior: Behavior normal.     Comments: Occasionally tearful during visit.        BP 118/84   Pulse (!) 107   Temp 98.1 F (36.7 C)   Resp 16   Ht 5\' 4"  (1.626 m)   Wt 141 lb 12 oz (64.3 kg)   LMP 02/16/2019   SpO2 96%   BMI 24.33 kg/m  Wt Readings from Last 3 Encounters:  03/02/19 141 lb 12 oz (64.3 kg)  02/02/19 143 lb 12.8 oz (65.2 kg)  01/27/19 141 lb (64 kg)       Assessment & Plan:  1. History of recent fall - right ankle with improving ROM, no worrisome findings on exam, she will let me know if she doesn't get resolution - Comprehensive metabolic panel  2. Blepharitis of left upper eyelid, unspecified  type - Ambulatory referral to Dermatology  3. Psoriasis - Ambulatory referral to Dermatology  4. Hydradenitis - Ambulatory referral to Dermatology - CBC with Differential  5. Fear of weight gain - discussed importance of adequate nutrition for healing and overall health - encouraged adequate protein, fat intake and discussed foods that would not raise her blood sugar  Olean Reeeborah Rickardo Brinegar, FNP-BC  [facility] at [facility], [facility]  03/02/2019 1:20 PM

## 2019-03-02 NOTE — Patient Instructions (Signed)
Good to see you today  If foot not better in 2 weeks or if worse, let me know and I will order MRI  I have put in an urgent derm referral  I will notify you of labs

## 2019-03-03 ENCOUNTER — Encounter (HOSPITAL_BASED_OUTPATIENT_CLINIC_OR_DEPARTMENT_OTHER): Admission: RE | Payer: Self-pay | Source: Home / Self Care

## 2019-03-03 ENCOUNTER — Ambulatory Visit (HOSPITAL_BASED_OUTPATIENT_CLINIC_OR_DEPARTMENT_OTHER): Admission: RE | Admit: 2019-03-03 | Payer: BC Managed Care – PPO | Source: Home / Self Care | Admitting: Plastic Surgery

## 2019-03-03 DIAGNOSIS — L718 Other rosacea: Secondary | ICD-10-CM | POA: Diagnosis not present

## 2019-03-03 DIAGNOSIS — D485 Neoplasm of uncertain behavior of skin: Secondary | ICD-10-CM | POA: Diagnosis not present

## 2019-03-03 SURGERY — PANNICULECTOMY
Anesthesia: General | Site: Abdomen

## 2019-03-11 ENCOUNTER — Encounter: Payer: BC Managed Care – PPO | Admitting: Plastic Surgery

## 2019-04-03 ENCOUNTER — Encounter: Payer: Self-pay | Admitting: Family Medicine

## 2019-04-03 ENCOUNTER — Other Ambulatory Visit: Payer: Self-pay

## 2019-04-03 ENCOUNTER — Ambulatory Visit (INDEPENDENT_AMBULATORY_CARE_PROVIDER_SITE_OTHER): Payer: BC Managed Care – PPO | Admitting: Family Medicine

## 2019-04-03 VITALS — BP 134/98 | HR 123 | Temp 97.9°F | Resp 16 | Ht 64.0 in | Wt 140.8 lb

## 2019-04-03 DIAGNOSIS — J9801 Acute bronchospasm: Secondary | ICD-10-CM

## 2019-04-03 DIAGNOSIS — Z0000 Encounter for general adult medical examination without abnormal findings: Secondary | ICD-10-CM | POA: Diagnosis not present

## 2019-04-03 DIAGNOSIS — R1111 Vomiting without nausea: Secondary | ICD-10-CM

## 2019-04-03 DIAGNOSIS — M6281 Muscle weakness (generalized): Secondary | ICD-10-CM | POA: Diagnosis not present

## 2019-04-03 DIAGNOSIS — M21371 Foot drop, right foot: Secondary | ICD-10-CM | POA: Diagnosis not present

## 2019-04-03 DIAGNOSIS — F419 Anxiety disorder, unspecified: Secondary | ICD-10-CM

## 2019-04-03 MED ORDER — ALBUTEROL SULFATE HFA 108 (90 BASE) MCG/ACT IN AERS: 2.0000 | INHALATION_SPRAY | RESPIRATORY_TRACT | 1 refills | Status: DC | PRN

## 2019-04-03 MED ORDER — HYDROXYZINE HCL 25 MG PO TABS: 12.5000 mg | ORAL_TABLET | Freq: Three times a day (TID) | ORAL | 0 refills | Status: DC | PRN

## 2019-04-03 NOTE — Addendum Note (Signed)
Addended by: Clarene Reamer B on: 04/03/2019 11:17 PM   Modules accepted: Orders

## 2019-04-03 NOTE — Patient Instructions (Signed)
Please call and schedule an appointment for screening mammogram. A referral is not needed.  [facility]- [phone]   Health Maintenance, Female Adopting a healthy lifestyle and getting preventive care are important in promoting health and wellness. Ask your health care provider about:  The right schedule for you to have regular tests and exams.  Things you can do on your own to prevent diseases and keep yourself healthy. What should I know about diet, weight, and exercise? Eat a healthy diet   Eat a diet that includes plenty of vegetables, fruits, low-fat dairy products, and lean protein.  Do not eat a lot of foods that are high in solid fats, added sugars, or sodium. Maintain a healthy weight Body mass index (BMI) is used to identify weight problems. It estimates body fat based on height and weight. Your health care provider can help determine your BMI and help you achieve or maintain a healthy weight. Get regular exercise Get regular exercise. This is one of the most important things you can do for your health. Most adults should:  Exercise for at least 150 minutes each week. The exercise should increase your heart rate and make you sweat (moderate-intensity exercise).  Do strengthening exercises at least twice a week. This is in addition to the moderate-intensity exercise.  Spend less time sitting. Even light physical activity can be beneficial. Watch cholesterol and blood lipids Have your blood tested for lipids and cholesterol at 49 years of age, then have this test every 5 years. Have your cholesterol levels checked more often if:  Your lipid or cholesterol levels are high.  You are older than 49 years of age.  You are at high risk for heart disease. What should I know about cancer screening? Depending on your health history and family history, you may need to have cancer  screening at various ages. This may include screening for:  Breast cancer.  Cervical cancer.  Colorectal cancer.  Skin cancer.  Lung cancer. What should I know about heart disease, diabetes, and high blood pressure? Blood pressure and heart disease  High blood pressure causes heart disease and increases the risk of stroke. This is more likely to develop in people who have high blood pressure readings, are of African descent, or are overweight.  Have your blood pressure checked: ? Every 3-5 years if you are 1-26 years of age. ? Every year if you are 21 years old or older. Diabetes Have regular diabetes screenings. This checks your fasting blood sugar level. Have the screening done:  Once every three years after age 81 if you are at a normal weight and have a low risk for diabetes.  More often and at a younger age if you are overweight or have a high risk for diabetes. What should I know about preventing infection? Hepatitis B If you have a higher risk for hepatitis B, you should be screened for this virus. Talk with your health care provider to find out if you are at risk for hepatitis B infection. Hepatitis C Testing is recommended for:  Everyone born from [birth year] through 1966.  Anyone with known risk factors for hepatitis C. Sexually transmitted infections (STIs)  Get screened for STIs, including gonorrhea and chlamydia, if: ? You are sexually active and are younger than 49 years of age. ? You are older than 49 years of age and your health care provider tells you that you are at risk for  this type of infection. ? Your sexual activity has changed since you were last screened, and you are at increased risk for chlamydia or gonorrhea. Ask your health care provider if you are at risk.  Ask your health care provider about whether you are at high risk for HIV. Your health care provider may recommend a prescription medicine to help prevent HIV infection. If you choose to take  medicine to prevent HIV, you should first get tested for HIV. You should then be tested every 3 months for as long as you are taking the medicine. Pregnancy  If you are about to stop having your period (premenopausal) and you may become pregnant, seek counseling before you get pregnant.  Take 400 to 800 micrograms (mcg) of folic acid every day if you become pregnant.  Ask for birth control (contraception) if you want to prevent pregnancy. Osteoporosis and menopause Osteoporosis is a disease in which the bones lose minerals and strength with aging. This can result in bone fractures. If you are 85 years old or older, or if you are at risk for osteoporosis and fractures, ask your health care provider if you should:  Be screened for bone loss.  Take a calcium or vitamin D supplement to lower your risk of fractures.  Be given hormone replacement therapy (HRT) to treat symptoms of menopause. Follow these instructions at home: Lifestyle  Do not use any products that contain nicotine or tobacco, such as cigarettes, e-cigarettes, and chewing tobacco. If you need help quitting, ask your health care provider.  Do not use street drugs.  Do not share needles.  Ask your health care provider for help if you need support or information about quitting drugs. Alcohol use  Do not drink alcohol if: ? Your health care provider tells you not to drink. ? You are pregnant, may be pregnant, or are planning to become pregnant.  If you drink alcohol: ? Limit how much you use to 0-1 drink a day. ? Limit intake if you are breastfeeding.  Be aware of how much alcohol is in your drink. In the [place] one drink equals one 12 oz bottle of beer (355 mL), one 5 oz glass of wine (148 mL), or one 1 oz glass of hard liquor (44 mL). General instructions  Schedule regular health, dental, and eye exams.  Stay current with your vaccines.  Tell your health care provider if: ? You often feel depressed. ? You have  ever been abused or do not feel safe at home. Summary  Adopting a healthy lifestyle and getting preventive care are important in promoting health and wellness.  Follow your health care provider's instructions about healthy diet, exercising, and getting tested or screened for diseases.  Follow your health care provider's instructions on monitoring your cholesterol and blood pressure. This information is not intended to replace advice given to you by your health care provider. Make sure you discuss any questions you have with your health care provider. Document Released: 10/30/2010 Document Revised: 04/09/2018 Document Reviewed: 04/09/2018 Elsevier Patient Education  2020 [employer].

## 2019-04-03 NOTE — Progress Notes (Addendum)
Subjective:    Patient ID: Misty [name], female    DOB: Aug 01, 1970, 48 y.o.   MRN: [id number]  HPI Chief Complaint  Patient presents with  . Annual Exam    This is a 48 yo female who presents today for annual exam.   Last CPE- 03/31/2018 Mammo- overdue, agreeable to call and schedule Pap- 12/19 Tdap- 03/31/2018 Flu-declines Eye- unknown Dental- not regular Exercise- not regular, limited to housework.   Right foot drop- she mentioned this at last visit, but felt that it was improving. She thinks she is having muscle wasting of lateral lower extremity. She does not know much about her family history on her father's side, but remembers seeing pictures of multiple members in wheelchairs. She is concerned for some type of genetic degenerative condition. Has chronic back pain, seen by pain management. Saw Dr. Ophelia Charter 10/19, had lumbar MRI which showed multilevel degenerative changes, left foraminal disc protrusion L4-5, mild spinal stenosis and mild subarticular stenosis L4-5. Unchanged from MRI 2015.   Restrictive eating- she has concerns about weight gain with her history of significant weight loss in past.   Past Medical History:  Diagnosis Date  . Abdominal pannus 02/02/2019  . Allergic rhinitis   . Anxiety   . Arthralgia of multiple joints   . Asthma   . Cervical spondylosis   . Chronic pain syndrome   . Chronic sinusitis   . Chronic suppurative otitis media of left ear   . DDD (degenerative disc disease), lumbar   . Depression   . Diabetes mellitus without complication (HCC)   . Dyspnea    with exertion  . Elevated LFTs   . Fibromyalgia   . Headache    tension/stress headaches  . Hidradenitis   . Hirsutism   . History of hematuria   . History of UTI   . Hyperlipidemia   . Hypertension   . Increased urinary frequency   . Insulin resistance   . Kidney stones   . Low back pain   . Lumbar spondylosis   . Meniere's disease   . OA (osteoarthritis)   . PCOS (polycystic  ovarian syndrome)   . Plantar fasciitis   . PONV (postoperative nausea and vomiting)   . Psoriasis   . PTSD (post-traumatic stress disorder)   . SI (sacroiliac) pain   . Swelling of eyelid    Bilateral, occ to wear she can't open them   Past Surgical History:  Procedure Laterality Date  . AXILLARY HIDRADENITIS EXCISION Bilateral 03/30/2013  . CESAREAN SECTION     x3  . CESAREAN SECTION  [phone]  . CYSTOSCOPY W/ URETERAL STENT PLACEMENT Left 10/01/2017   Procedure: CYSTOSCOPY WITH RETROGRADE PYELOGRAM/URETERAL STENT PLACEMENT;  Surgeon: Sebastian Ache, MD;  Location: [facility];  Service: Urology;  Laterality: Left;  . CYSTOSCOPY WITH RETROGRADE PYELOGRAM, URETEROSCOPY AND STENT PLACEMENT Bilateral 10/25/2017   Procedure: CYSTOSCOPY WITH RETROGRADE PYELOGRAM, URETEROSCOPY AND STENT PLACEMENT;  Surgeon: Sebastian Ache, MD;  [facility];  Service: Urology;  Laterality: Bilateral;  . DENTAL SURGERY    . FINGER SURGERY    . HOLMIUM LASER APPLICATION Bilateral 10/25/2017   Procedure: HOLMIUM LASER APPLICATION;  Surgeon: Sebastian Ache, MD;  Location: [facility];  Service: Urology;  Laterality: Bilateral;  . KIDNEY STONE SURGERY     3-4 times  . MYRINGOTOMY WITH TUBE PLACEMENT Bilateral   . SHOULDER SURGERY     x2  . TONSILLECTOMY     Family History  Problem  Relation Age of Onset  . COPD Mother   . Hypertension Mother   . Arthritis Maternal Grandmother    Social History   Tobacco Use  . Smoking status: Former Research scientist (life sciences)  . Smokeless tobacco: Never Used  . Tobacco comment: 2-3 cigarettes per day 3 years; quit 2 years ago  Substance Use Topics  . Alcohol use: Not Currently    Alcohol/week: 0.0 standard drinks    Comment: occasional   . Drug use: No        Review of Systems  Constitutional: Negative.   HENT: Negative.   Eyes: Negative.   Respiratory: Positive for wheezing (requests refil of albuterol for rare use).    Cardiovascular: Negative.   Gastrointestinal: Positive for constipation (chronic).       Vomiting- about once a week, awakens in the night. Little appetite. ? Restrictive eating. Drinks protein shakes, Miso soup.   Endocrine: Positive for cold intolerance (since weight loss).  Genitourinary: Negative.   Musculoskeletal: Positive for back pain (chronic, sees pain management).  Skin: Positive for rash (hydranitis).  Allergic/Immunologic: Negative.   Neurological: Positive for weakness (right arm, right foot).  Hematological: Bruises/bleeds easily.  Psychiatric/Behavioral: The patient is nervous/anxious (long standing, has had clonazepam in past. Rarely took. Can not have now due to pain contract. ).        Objective:   Physical Exam Vitals signs reviewed.  Constitutional:      Appearance: Normal appearance. She is normal weight.  HENT:     Head: Normocephalic and atraumatic.  Neck:     Musculoskeletal: Normal range of motion and neck supple.  Cardiovascular:     Rate and Rhythm: Tachycardia present.     Heart sounds: Normal heart sounds.     Comments: HR 100 on auscultation.  Pulmonary:     Effort: Pulmonary effort is normal.     Breath sounds: Normal breath sounds.  Abdominal:     General: Abdomen is flat. Bowel sounds are normal. There is no distension.     Palpations: Abdomen is soft. There is no mass.     Tenderness: There is no abdominal tenderness. There is no guarding or rebound.     Hernia: No hernia is present.  Musculoskeletal:     Right lower leg: No edema.     Left lower leg: No edema.  Skin:    General: Skin is warm and dry.     Comments: Bilateral lower extremities with patchy, chronic appearing areas of hyperpigmentation. Cheeks flushed. Excess skin of arms, abdomen, thighs.   Neurological:     Mental Status: She is alert and oriented to person, place, and time.     Motor: Weakness (right foot, flexion. ) present.     Deep Tendon Reflexes: Reflexes normal.   Psychiatric:        Mood and Affect: Mood normal.        Behavior: Behavior normal.        Thought Content: Thought content normal.        Judgment: Judgment normal.     BP (!) 134/98   Pulse (!) 123   Temp 97.9 F (36.6 C)   Resp 16   Ht 5\' 4"  (1.626 m)   Wt 140 lb 12 oz (63.8 kg)   LMP 03/03/2019   SpO2 98%   BMI 24.16 kg/m  Wt Readings from Last 3 Encounters:  04/03/19 140 lb 12 oz (63.8 kg)  03/02/19 141 lb 12 oz (64.3 kg)  02/02/19 143 lb  12.8 oz (65.2 kg)       Assessment & Plan:  1. Annual physical exam - Discussed and encouraged healthy lifestyle choices- adequate sleep, regular exercise, stress management and healthy food choices.  - she will call and schedule mammogram  2. Bronchospasm - albuterol (VENTOLIN HFA) 108 (90 Base) MCG/ACT inhaler; Inhale 2 puffs into the lungs every 4 (four) hours as needed for wheezing or shortness of breath (cough, shortness of breath or wheezing.).  Dispense: 16 g; Refill: 1  3. Right foot drop - unclear etiology, ? Related to lumbar spine disorder or other cause - Ambulatory referral to Neurology  4. Muscle weakness - Ambulatory referral to Neurology  5. Non-intractable vomiting without nausea, unspecified vomiting type - have asked her to keep a log of episodes, food prior to episodes, any additional symptoms  6. Anxiety - hydroxyzine 12.5-25 mg q8 hours prn  Olean Reeeborah Angela Vazguez, FNP-BC  [facility], [facility]  This visit occurred during the SARS-CoV-2 public health emergency.  Safety protocols were in place, including screening questions prior to the visit, additional usage of staff PPE, and extensive cleaning of exam room while observing appropriate contact time as indicated for disinfecting solutions.    04/03/2019 10:59 PM

## 2019-04-22 ENCOUNTER — Encounter: Payer: Self-pay | Admitting: Family Medicine

## 2019-05-28 DIAGNOSIS — G894 Chronic pain syndrome: Secondary | ICD-10-CM | POA: Diagnosis not present

## 2019-05-28 DIAGNOSIS — M170 Bilateral primary osteoarthritis of knee: Secondary | ICD-10-CM | POA: Diagnosis not present

## 2019-05-28 DIAGNOSIS — M533 Sacrococcygeal disorders, not elsewhere classified: Secondary | ICD-10-CM | POA: Diagnosis not present

## 2019-05-28 DIAGNOSIS — M47816 Spondylosis without myelopathy or radiculopathy, lumbar region: Secondary | ICD-10-CM | POA: Diagnosis not present

## 2019-06-01 ENCOUNTER — Other Ambulatory Visit: Payer: Self-pay | Admitting: *Deleted

## 2019-06-01 DIAGNOSIS — E282 Polycystic ovarian syndrome: Secondary | ICD-10-CM

## 2019-06-01 MED ORDER — METFORMIN HCL 850 MG PO TABS: ORAL_TABLET | ORAL | 1 refills | Status: DC

## 2019-06-09 DIAGNOSIS — M25561 Pain in right knee: Secondary | ICD-10-CM | POA: Diagnosis not present

## 2019-06-09 DIAGNOSIS — G8929 Other chronic pain: Secondary | ICD-10-CM | POA: Diagnosis not present

## 2019-06-09 DIAGNOSIS — M1711 Unilateral primary osteoarthritis, right knee: Secondary | ICD-10-CM | POA: Diagnosis not present

## 2019-07-07 ENCOUNTER — Telehealth: Payer: Self-pay | Admitting: Family Medicine

## 2019-07-07 NOTE — Telephone Encounter (Signed)
PA initiated on behalf of [facility] - I have completed the PA on [email or web address] Waiting on determination.   Misty [name] (Key: [id number]) Your information has been submitted to [facility] [place]. [employer] [place] will review the request and notify you of the determination decision directly, typically within 72 hours of receiving all information.  You will also receive your request decision electronically. To check for an update later, open this request again from your dashboard.  If [employer] has not responded within the specified timeframe or if you have any questions about your PA submission, contact [employer] [place] directly at [phone].

## 2019-07-08 ENCOUNTER — Other Ambulatory Visit: Payer: Self-pay | Admitting: Family Medicine

## 2019-07-08 MED ORDER — VENTOLIN HFA 108 (90 BASE) MCG/ACT IN AERS: 2.0000 | INHALATION_SPRAY | Freq: Four times a day (QID) | RESPIRATORY_TRACT | 0 refills | Status: AC | PRN

## 2019-07-08 NOTE — Telephone Encounter (Signed)
New RX sent to patient's pharmacy. PA denial sent to scan.

## 2019-07-08 NOTE — Telephone Encounter (Signed)
PA denied.   Letter given to [facility] with alternatives.

## 2019-07-08 NOTE — Progress Notes (Signed)
New RX for Ventolin HFA sent to patient pharmacy. Brand Ventolin listed as alternative on patient's formulary.

## 2019-07-15 ENCOUNTER — Encounter: Payer: Self-pay | Admitting: Family Medicine

## 2019-07-22 ENCOUNTER — Telehealth: Payer: Self-pay | Admitting: Family Medicine

## 2019-07-22 NOTE — Telephone Encounter (Signed)
Thank you for your efforts.  

## 2019-07-22 NOTE — Telephone Encounter (Signed)
After multiple attempts to contact patient since December we are closing out Neurology Referral. Patient never contacted [place] back.

## 2019-07-26 IMAGING — DX DG SHOULDER 2+V*R*
3 series · 3 of 3 positions shown · non-contrast
Comparison: None.

CLINICAL DATA: Prior right shoulder surgery. Pain, decreased range
of motion.

EXAM:
RIGHT SHOULDER - 2+ VIEW

[shoulder axial]
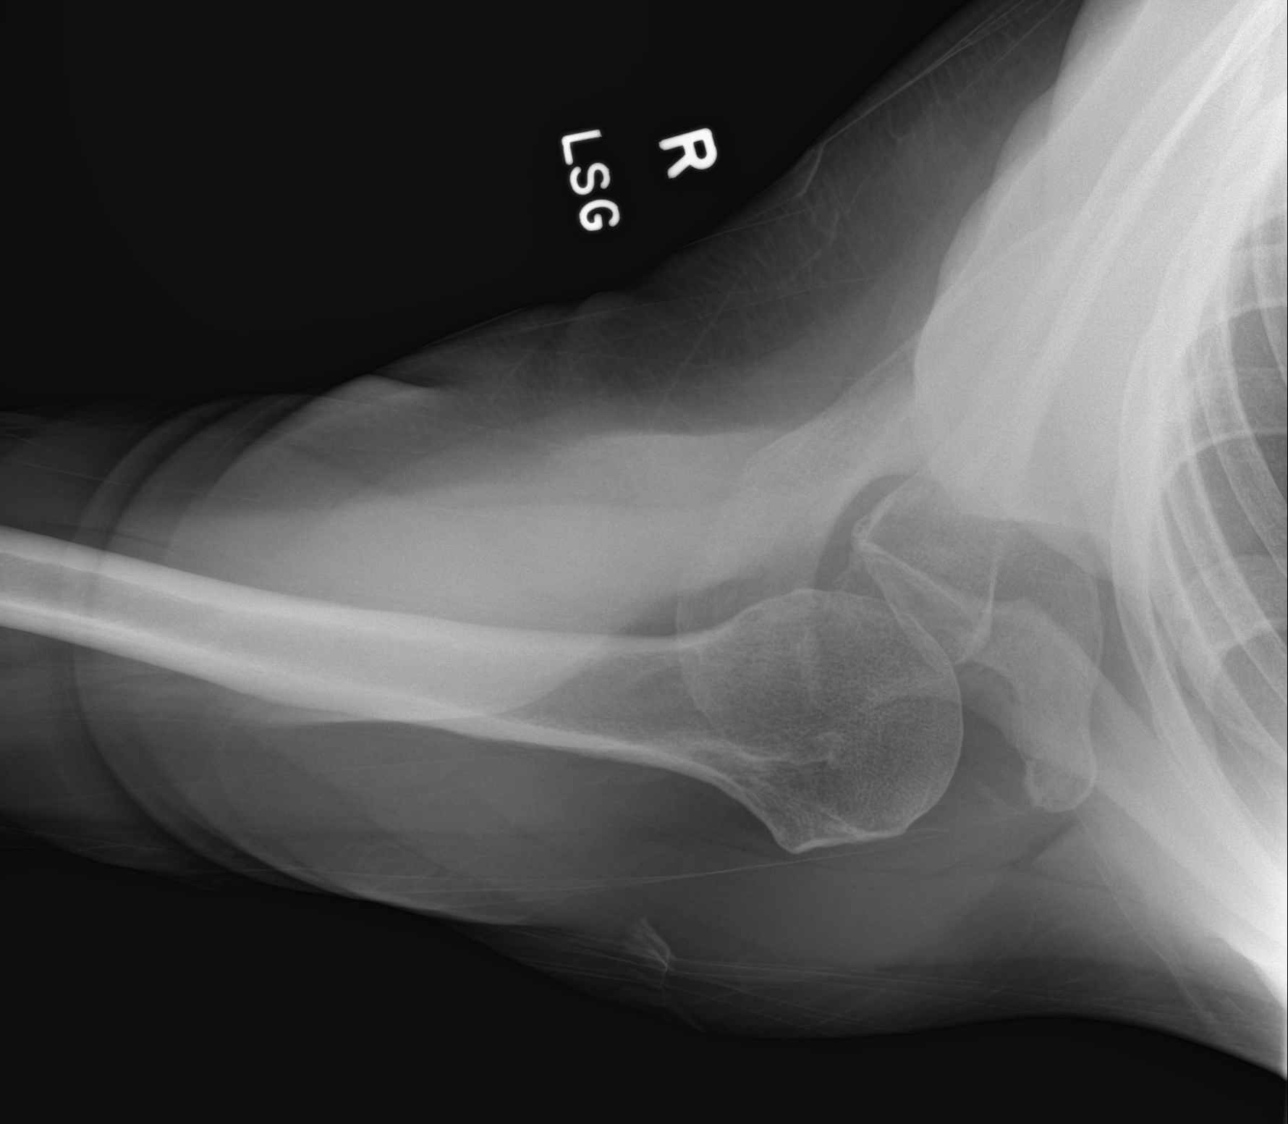

[shoulder ap]
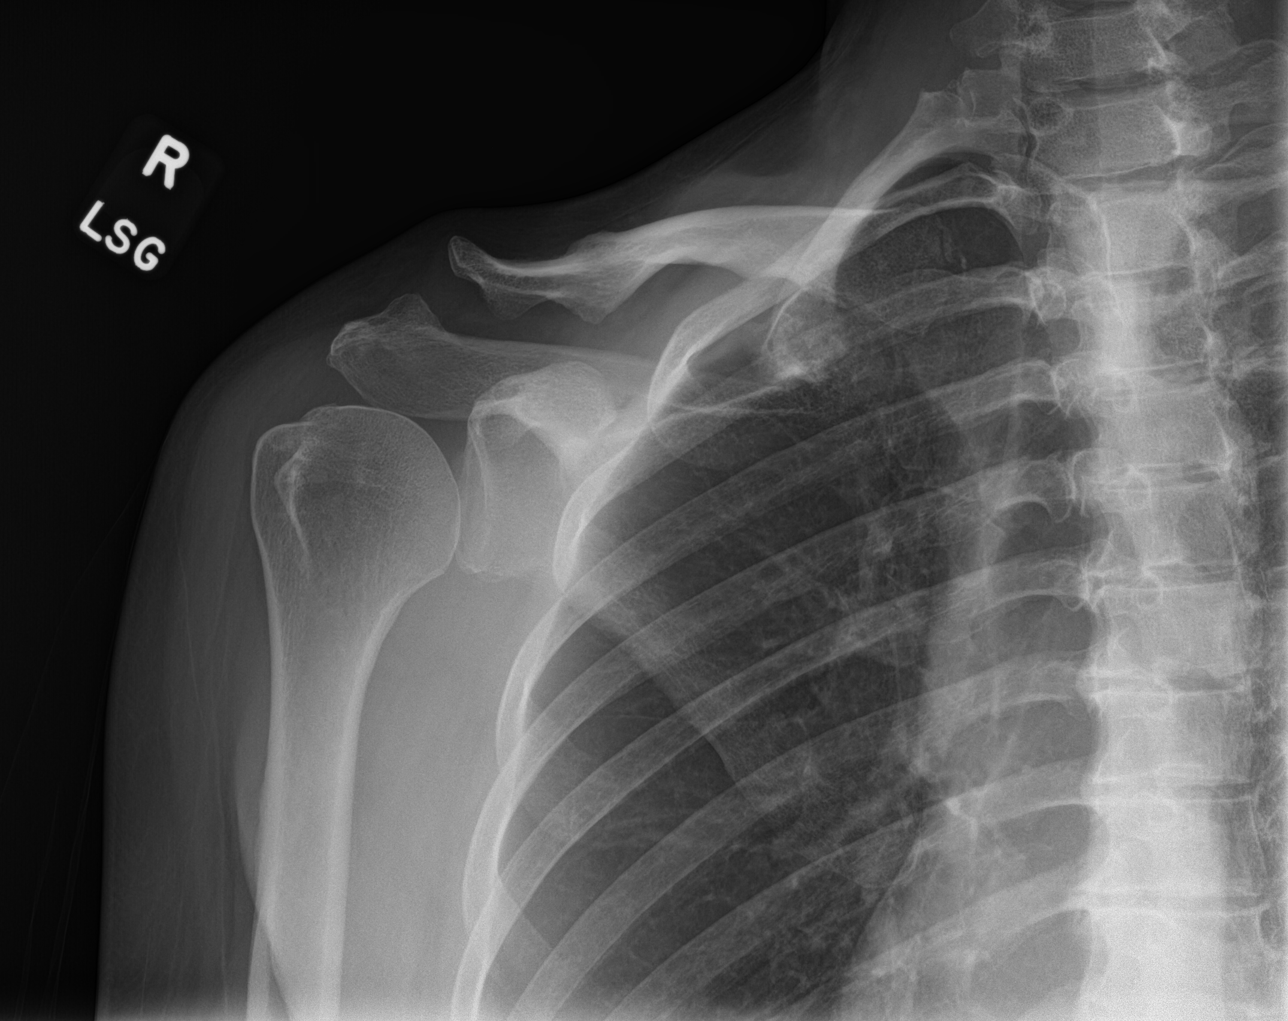

[shoulder y-view]
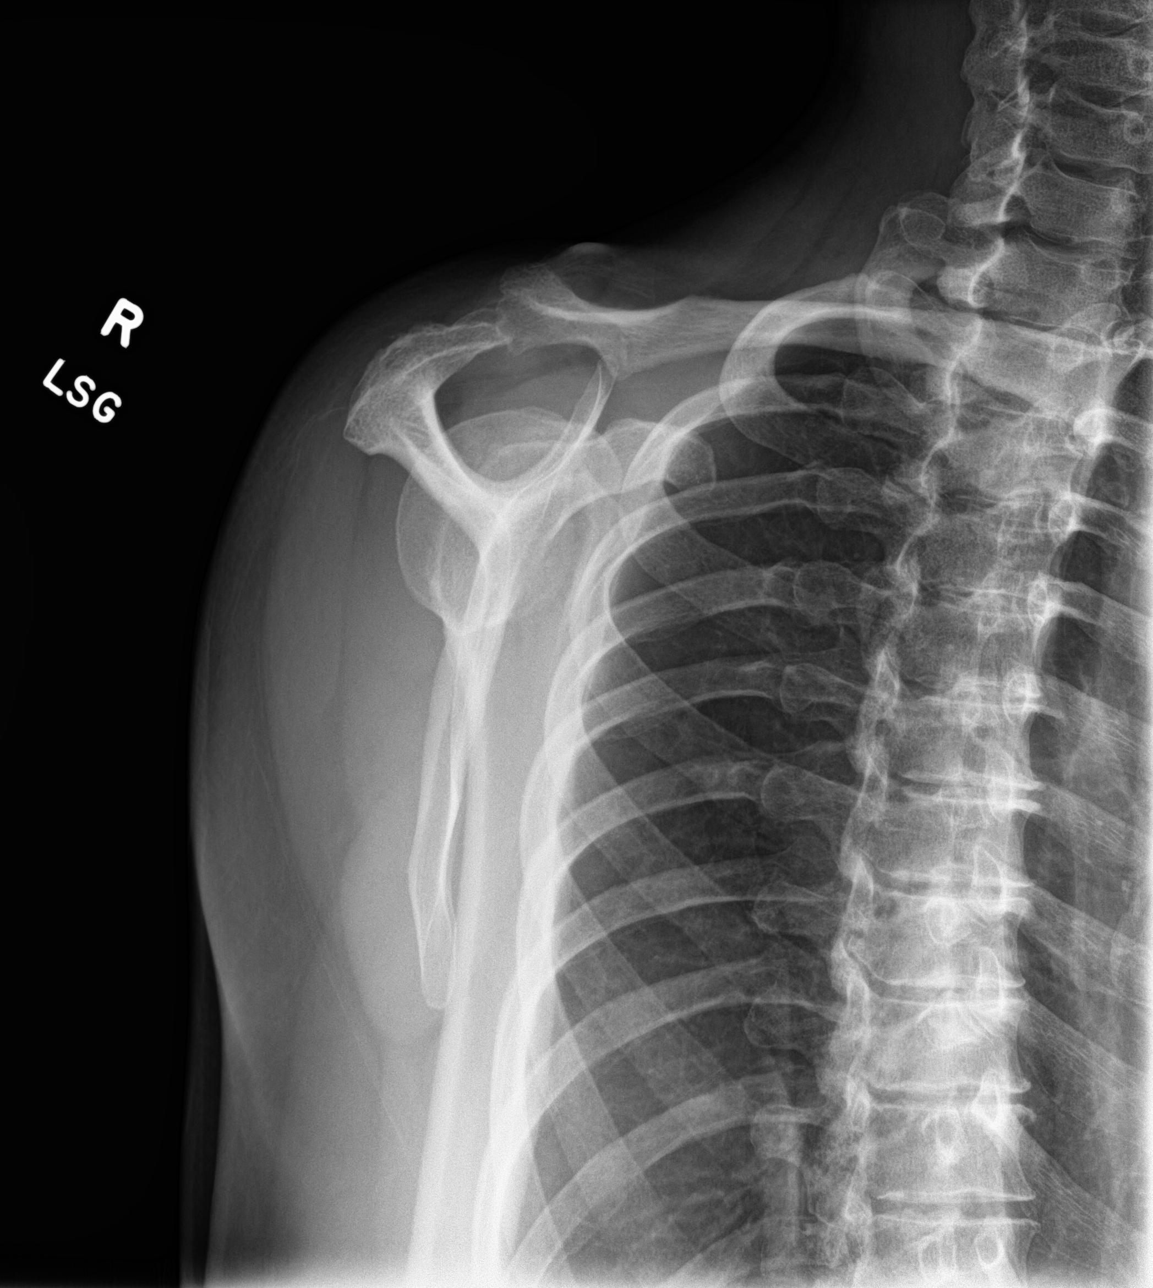

[3 of 3 positions shown; findings below may reference images not displayed]

FINDINGS: Prior resection of the distal clavicle. Glenohumeral joint is
maintained. No acute bony abnormality. Specifically, no fracture,
subluxation, or dislocation.
IMPRESSION: No acute bony abnormality.

## 2019-08-26 DIAGNOSIS — M797 Fibromyalgia: Secondary | ICD-10-CM | POA: Diagnosis not present

## 2019-08-26 DIAGNOSIS — M533 Sacrococcygeal disorders, not elsewhere classified: Secondary | ICD-10-CM | POA: Diagnosis not present

## 2019-08-26 DIAGNOSIS — M170 Bilateral primary osteoarthritis of knee: Secondary | ICD-10-CM | POA: Diagnosis not present

## 2019-08-26 DIAGNOSIS — M47812 Spondylosis without myelopathy or radiculopathy, cervical region: Secondary | ICD-10-CM | POA: Diagnosis not present

## 2019-09-09 DIAGNOSIS — N200 Calculus of kidney: Secondary | ICD-10-CM | POA: Diagnosis not present

## 2019-09-09 DIAGNOSIS — Z79899 Other long term (current) drug therapy: Secondary | ICD-10-CM | POA: Diagnosis not present

## 2019-09-09 DIAGNOSIS — N1831 Chronic kidney disease, stage 3a: Secondary | ICD-10-CM | POA: Diagnosis not present

## 2019-10-12 ENCOUNTER — Ambulatory Visit: Payer: BC Managed Care – PPO | Admitting: Family Medicine

## 2019-10-14 ENCOUNTER — Other Ambulatory Visit: Payer: Self-pay

## 2019-10-14 ENCOUNTER — Ambulatory Visit (INDEPENDENT_AMBULATORY_CARE_PROVIDER_SITE_OTHER)
Admission: RE | Admit: 2019-10-14 | Discharge: 2019-10-14 | Disposition: A | Discharge status: Home / Self Care | Payer: BC Managed Care – PPO | Source: Ambulatory Visit | Attending: Family Medicine | Admitting: Family Medicine

## 2019-10-14 ENCOUNTER — Encounter: Payer: Self-pay | Admitting: Family Medicine

## 2019-10-14 ENCOUNTER — Ambulatory Visit (INDEPENDENT_AMBULATORY_CARE_PROVIDER_SITE_OTHER): Payer: BC Managed Care – PPO | Admitting: Family Medicine

## 2019-10-14 VITALS — BP 130/88 | HR 119 | Temp 98.2°F | Ht 64.0 in | Wt 147.2 lb

## 2019-10-14 DIAGNOSIS — M25532 Pain in left wrist: Secondary | ICD-10-CM

## 2019-10-14 DIAGNOSIS — M7989 Other specified soft tissue disorders: Secondary | ICD-10-CM

## 2019-10-14 DIAGNOSIS — S5012XA Contusion of left forearm, initial encounter: Secondary | ICD-10-CM | POA: Diagnosis not present

## 2019-10-14 DIAGNOSIS — S6992XA Unspecified injury of left wrist, hand and finger(s), initial encounter: Secondary | ICD-10-CM | POA: Diagnosis not present

## 2019-10-14 DIAGNOSIS — M79602 Pain in left arm: Secondary | ICD-10-CM | POA: Diagnosis not present

## 2019-10-14 DIAGNOSIS — W19XXXA Unspecified fall, initial encounter: Secondary | ICD-10-CM

## 2019-10-14 NOTE — Patient Instructions (Signed)
Good to see you today  Go to xray, we will call you with results and next steps  We are out of slings for your size, I recommend you get an over the counter sling at [facility], etc to keep your arm supported.  Support on pillows when sitting

## 2019-10-14 NOTE — Progress Notes (Signed)
Subjective:    Patient ID: Misty [name], female    DOB: November 28, 1970, 49 y.o.   MRN: [id number]  HPI Chief Complaint  Patient presents with   Arm Pain    Pt fell 6/11 in back yard and landed on a pile of bricks. Decreased ROM in wrist. Bruising and scrapes. Pt notes severe swelling in first few days. Pt notes a burning sensation in palm of hand. Pt was not seen or treated for injury   This is a 49 year old female, accompanied by her husband, who presents today with above chief complaint.  4 days ago, she was walking in her backyard which had some standing water.  She slipped and fell onto her left forearm on a pile of bricks.  Since that time she has had swelling, pain, bruising and decreased range of motion of her left wrist.  She did not lose consciousness.  She has not been taking anything.  She has used ice.  No pain in shoulder or elbow.   Review of Systems    Per HPI Objective:   Physical Exam Vitals reviewed.  Constitutional:      General: She is not in acute distress.    Appearance: Normal appearance. She is normal weight. She is not ill-appearing, toxic-appearing or diaphoretic.  HENT:     Head: Normocephalic and atraumatic.  Cardiovascular:     Rate and Rhythm: Normal rate.  Pulmonary:     Effort: Pulmonary effort is normal.  Musculoskeletal:     Left shoulder: Normal.     Left upper arm: Normal.     Left elbow: Normal.     Left forearm: Swelling (medial, lateral, extensive resolving bruise) and tenderness present.     Left wrist: Swelling and tenderness present. Decreased range of motion. Normal pulse.  Skin:    General: Skin is dry.     Comments: Extremities symmetrically cool to touch.   Neurological:     Mental Status: She is alert and oriented to person, place, and time.  Psychiatric:        Mood and Affect: Mood normal.        Behavior: Behavior normal.        Thought Content: Thought content normal.        Judgment: Judgment normal.      BP 130/88 (BP  Location: Right Arm, Patient Position: Sitting, Cuff Size: Normal)    Pulse (!) 119    Temp 98.2 F (36.8 C) (Temporal)    Ht 5\' 4"  (1.626 m)    Wt 147 lb 3.2 oz (66.8 kg)    SpO2 93%    BMI 25.27 kg/m  Wt Readings from Last 3 Encounters:  10/14/19 147 lb 3.2 oz (66.8 kg)  04/03/19 140 lb 12 oz (63.8 kg)  03/02/19 141 lb 12 oz (64.3 kg)        Assessment & Plan:  1. Fall, initial encounter -Unfortunately, we do not have appropriate sized sling to place patient in in the office.  I have advised her to get an over-the-counter sling for support we will get x-rays with stat read in case orthopedic intervention is needed. Patient Instructions  Good to see you today  Go to xray, we will call you with results and next steps  We are out of slings for your size, I recommend you get an over the counter sling at [facility], etc to keep your arm supported.  Support on pillows when sitting   - DG Wrist Complete Left;  Future - DG Forearm Left; Future  2. Left arm pain - DG Forearm Left; Future  3. Left arm swelling - DG Forearm Left; Future  4. Left wrist pain - DG Wrist Complete Left; Future  5. Injury of left wrist, initial encounter - DG Wrist Complete Left; Future  This visit occurred during the SARS-CoV-2 public health emergency.  Safety protocols were in place, including screening questions prior to the visit, additional usage of staff PPE, and extensive cleaning of exam room while observing appropriate contact time as indicated for disinfecting solutions.    Clarene Reamer, FNP-BC  [facility]  10/14/2019 5:51 PM

## 2019-10-20 DIAGNOSIS — M533 Sacrococcygeal disorders, not elsewhere classified: Secondary | ICD-10-CM | POA: Diagnosis not present

## 2019-11-20 ENCOUNTER — Other Ambulatory Visit: Payer: Self-pay | Admitting: Family Medicine

## 2019-11-20 DIAGNOSIS — E282 Polycystic ovarian syndrome: Secondary | ICD-10-CM

## 2019-11-24 DIAGNOSIS — G894 Chronic pain syndrome: Secondary | ICD-10-CM | POA: Diagnosis not present

## 2019-11-24 DIAGNOSIS — M533 Sacrococcygeal disorders, not elsewhere classified: Secondary | ICD-10-CM | POA: Diagnosis not present

## 2019-11-24 DIAGNOSIS — M5136 Other intervertebral disc degeneration, lumbar region: Secondary | ICD-10-CM | POA: Diagnosis not present

## 2019-11-24 DIAGNOSIS — M170 Bilateral primary osteoarthritis of knee: Secondary | ICD-10-CM | POA: Diagnosis not present

## 2020-02-20 ENCOUNTER — Other Ambulatory Visit: Payer: Self-pay | Admitting: Family Medicine

## 2020-02-20 DIAGNOSIS — E282 Polycystic ovarian syndrome: Secondary | ICD-10-CM

## 2020-02-22 DIAGNOSIS — M170 Bilateral primary osteoarthritis of knee: Secondary | ICD-10-CM | POA: Diagnosis not present

## 2020-02-22 DIAGNOSIS — M533 Sacrococcygeal disorders, not elsewhere classified: Secondary | ICD-10-CM | POA: Diagnosis not present

## 2020-02-22 DIAGNOSIS — M5136 Other intervertebral disc degeneration, lumbar region: Secondary | ICD-10-CM | POA: Diagnosis not present

## 2020-02-22 NOTE — Telephone Encounter (Signed)
Do not see A1c in over a year. Ok to call in 90 day and have patient come in for follow up?

## 2020-02-22 NOTE — Telephone Encounter (Signed)
Patient needs office visit for additional refills. Please call and schedule her. She should have enough to last until she can get in.

## 2020-02-23 NOTE — Telephone Encounter (Signed)
Lvm for pt to call office and schedule appt.

## 2020-02-23 NOTE — Telephone Encounter (Signed)
Left message to return call to our office.  

## 2020-03-02 ENCOUNTER — Other Ambulatory Visit: Payer: Self-pay

## 2020-03-02 DIAGNOSIS — E282 Polycystic ovarian syndrome: Secondary | ICD-10-CM

## 2020-03-02 MED ORDER — METFORMIN HCL 850 MG PO TABS: ORAL_TABLET | ORAL | 1 refills | Status: DC

## 2020-12-21 ENCOUNTER — Other Ambulatory Visit: Payer: Self-pay | Admitting: Family

## 2020-12-21 ENCOUNTER — Telehealth: Payer: Self-pay

## 2020-12-21 DIAGNOSIS — E282 Polycystic ovarian syndrome: Secondary | ICD-10-CM

## 2020-12-21 MED ORDER — METFORMIN HCL 850 MG PO TABS: ORAL_TABLET | ORAL | 1 refills | Status: AC

## 2020-12-21 NOTE — Telephone Encounter (Signed)
Refill request for Metformin 850 mg tablets  LOV - 10/14/19 Next OV - not scheduled Last refill - 03/02/20 #180/1

## 2021-03-21 ENCOUNTER — Other Ambulatory Visit: Payer: Self-pay | Admitting: Family

## 2021-03-21 DIAGNOSIS — E282 Polycystic ovarian syndrome: Secondary | ICD-10-CM

## 2021-08-31 ENCOUNTER — Other Ambulatory Visit: Payer: Self-pay | Admitting: Family

## 2021-08-31 DIAGNOSIS — E282 Polycystic ovarian syndrome: Secondary | ICD-10-CM
